# Patient Record
Sex: Female | Born: 1985 | Race: Black or African American | Hispanic: No | Marital: Single | Smoking: Never smoker
Health system: Southern US, Community
[De-identification: ages and names within clinical notes are randomized; demographics above are authoritative.]

## PROBLEM LIST (undated history)

## (undated) ENCOUNTER — Inpatient Hospital Stay (HOSPITAL_COMMUNITY): Payer: Self-pay

## (undated) DIAGNOSIS — G932 Benign intracranial hypertension: Secondary | ICD-10-CM

## (undated) DIAGNOSIS — O1420 HELLP syndrome (HELLP), unspecified trimester: Secondary | ICD-10-CM

## (undated) DIAGNOSIS — I10 Essential (primary) hypertension: Secondary | ICD-10-CM

## (undated) HISTORY — PX: DILATION AND CURETTAGE OF UTERUS: SHX78

## (undated) HISTORY — PX: INDUCED ABORTION: SHX677

---

## 2013-02-12 ENCOUNTER — Encounter (HOSPITAL_COMMUNITY): Payer: Self-pay | Admitting: Emergency Medicine

## 2013-02-12 ENCOUNTER — Emergency Department (HOSPITAL_COMMUNITY)
Admission: EM | Admit: 2013-02-12 | Discharge: 2013-02-12 | Discharge status: Against Medical Advice | Payer: Self-pay | Attending: Emergency Medicine | Admitting: Emergency Medicine

## 2013-02-12 DIAGNOSIS — N898 Other specified noninflammatory disorders of vagina: Secondary | ICD-10-CM | POA: Insufficient documentation

## 2013-02-12 DIAGNOSIS — O2390 Unspecified genitourinary tract infection in pregnancy, unspecified trimester: Secondary | ICD-10-CM | POA: Insufficient documentation

## 2013-02-12 DIAGNOSIS — O9989 Other specified diseases and conditions complicating pregnancy, childbirth and the puerperium: Secondary | ICD-10-CM | POA: Insufficient documentation

## 2013-02-12 DIAGNOSIS — R109 Unspecified abdominal pain: Secondary | ICD-10-CM | POA: Insufficient documentation

## 2013-02-12 NOTE — ED Notes (Signed)
Pt states she found out she was pregnant last Sunday.  States that she had been cramping since then and last night she started having some vaginal bleeding.  Pain 5/10.  States that there are clots in the bleeding.

## 2013-02-12 NOTE — ED Notes (Signed)
Pt called to reassess, no answer.  

## 2013-02-13 ENCOUNTER — Emergency Department (HOSPITAL_COMMUNITY): Payer: Self-pay

## 2013-02-13 ENCOUNTER — Emergency Department (HOSPITAL_COMMUNITY)
Admission: EM | Admit: 2013-02-13 | Discharge: 2013-02-14 | Disposition: A | Discharge status: Home / Self Care | Payer: Self-pay | Attending: Emergency Medicine | Admitting: Emergency Medicine

## 2013-02-13 ENCOUNTER — Encounter (HOSPITAL_COMMUNITY): Payer: Self-pay | Admitting: Family Medicine

## 2013-02-13 DIAGNOSIS — Z79899 Other long term (current) drug therapy: Secondary | ICD-10-CM | POA: Insufficient documentation

## 2013-02-13 DIAGNOSIS — O200 Threatened abortion: Secondary | ICD-10-CM | POA: Insufficient documentation

## 2013-02-13 DIAGNOSIS — O9989 Other specified diseases and conditions complicating pregnancy, childbirth and the puerperium: Secondary | ICD-10-CM | POA: Insufficient documentation

## 2013-02-13 DIAGNOSIS — R000 Tachycardia, unspecified: Secondary | ICD-10-CM | POA: Insufficient documentation

## 2013-02-13 DIAGNOSIS — N898 Other specified noninflammatory disorders of vagina: Secondary | ICD-10-CM | POA: Insufficient documentation

## 2013-02-13 DIAGNOSIS — Z3490 Encounter for supervision of normal pregnancy, unspecified, unspecified trimester: Secondary | ICD-10-CM

## 2013-02-13 DIAGNOSIS — O209 Hemorrhage in early pregnancy, unspecified: Secondary | ICD-10-CM | POA: Insufficient documentation

## 2013-02-13 LAB — URINALYSIS, ROUTINE W REFLEX MICROSCOPIC
Bilirubin Urine: NEGATIVE
Glucose, UA: NEGATIVE mg/dL
Nitrite: NEGATIVE
Specific Gravity, Urine: 1.009 (ref 1.005–1.030)
pH: 6 (ref 5.0–8.0)

## 2013-02-13 LAB — WET PREP, GENITAL: Trich, Wet Prep: NONE SEEN

## 2013-02-13 LAB — CBC WITH DIFFERENTIAL/PLATELET
HCT: 41 % (ref 36.0–46.0)
Hemoglobin: 13.6 g/dL (ref 12.0–15.0)
Lymphs Abs: 2.7 10*3/uL (ref 0.7–4.0)
Monocytes Relative: 8 % (ref 3–12)
Neutro Abs: 3.5 10*3/uL (ref 1.7–7.7)
Neutrophils Relative %: 51 % (ref 43–77)
RBC: 4.96 MIL/uL (ref 3.87–5.11)

## 2013-02-13 LAB — POCT PREGNANCY, URINE: Preg Test, Ur: POSITIVE — AB

## 2013-02-13 LAB — COMPREHENSIVE METABOLIC PANEL
Albumin: 4.2 g/dL (ref 3.5–5.2)
Alkaline Phosphatase: 64 U/L (ref 39–117)
BUN: 6 mg/dL (ref 6–23)
CO2: 26 mEq/L (ref 19–32)
Chloride: 99 mEq/L (ref 96–112)
GFR calc Af Amer: >90 mL/min (ref >90)
GFR calc non Af Amer: >90 mL/min (ref >90)
Glucose, Bld: 128 mg/dL — ABNORMAL HIGH (ref 70–99)
Potassium: 3.2 mEq/L — ABNORMAL LOW (ref 3.5–5.1)
Total Bilirubin: 0.3 mg/dL (ref 0.3–1.2)

## 2013-02-13 LAB — URINE MICROSCOPIC-ADD ON

## 2013-02-13 LAB — HCG, QUANTITATIVE, PREGNANCY: hCG, Beta Chain, Quant, S: 9629 m[IU]/mL — ABNORMAL HIGH (ref <5)

## 2013-02-13 NOTE — ED Provider Notes (Signed)
History     CSN: [id number]  Arrival date & time 02/13/13  1652   First MD Initiated Contact with Patient 02/13/13 1949      Chief Complaint  Patient presents with  . Threatened Miscarriage    (Consider location/radiation/quality/duration/timing/severity/associated sxs/prior treatment) HPI Comments: States LMP was Janurary bled for 2 weeks was getting off Depo at that time than no menses in Feb.  Yesterday had bleeding for 1-2 hours that stopped, than today had brown discharge  The history is provided by the patient.    History reviewed. No pertinent past medical history.  History reviewed. No pertinent past surgical history.  History reviewed. No pertinent family history.  History  Substance Use Topics  . Smoking status: Never Smoker   . Smokeless tobacco: Not on file  . Alcohol Use: Yes    OB History   Grav Para Term Preterm Abortions TAB SAB Ect Mult Living                  Review of Systems  Constitutional: Negative for fever and chills.  Respiratory: Negative for shortness of breath.   Cardiovascular: Negative for chest pain.  Gastrointestinal: Negative for abdominal pain.  Genitourinary: Positive for vaginal bleeding. Negative for dysuria, frequency, vaginal discharge and vaginal pain.  Musculoskeletal: Negative for back pain.  All other systems reviewed and are negative.    Allergies  Review of patient's allergies indicates no known allergies.  Home Medications   Current Outpatient Rx  Name  Route  Sig  Dispense  Refill  . Biotin 1 MG CAPS   Oral   Take 2 capsules by mouth daily.          . naproxen sodium (ANAPROX) 220 MG tablet   Oral   Take 660 mg by mouth daily as needed.         . Prenatal Vit-Fe Fumarate-FA (PRENATAL COMPLETE) 14-0.4 MG TABS   Oral   Take 1 tablet by mouth 1 day or 1 dose.   60 each   0     BP 129/69  Pulse 110  Temp(Src) 98 F (36.7 C) (Oral)  SpO2 100%  LMP 12/15/2012  Physical Exam  Nursing note and  vitals reviewed. Constitutional: She is oriented to person, place, and time. She appears well-developed and well-nourished.  HENT:  Head: Normocephalic.  Eyes: Pupils are equal, round, and reactive to light.  Neck: Normal range of motion.  Cardiovascular: Regular rhythm.  Tachycardia present.   Pulmonary/Chest: Effort normal and breath sounds normal.  Abdominal: Soft. Bowel sounds are normal. There is no tenderness.  Genitourinary: Uterus is enlarged. Uterus is not tender. Cervix exhibits no discharge. Right adnexum displays no tenderness. Left adnexum displays no tenderness. No bleeding around the vagina. Vaginal discharge found.  Musculoskeletal: Normal range of motion.  Neurological: She is alert and oriented to person, place, and time.  Skin: Skin is dry.    ED Course  Procedures (including critical care time)  Labs Reviewed  WET PREP, GENITAL - Abnormal; Notable for the following:    Yeast Wet Prep HPF POC FEW (*)    Clue Cells Wet Prep HPF POC FEW (*)    WBC, Wet Prep HPF POC MODERATE (*)    All other components within normal limits  URINALYSIS, ROUTINE W REFLEX MICROSCOPIC - Abnormal; Notable for the following:    APPearance CLOUDY (*)    Hgb urine dipstick MODERATE (*)    Leukocytes, UA MODERATE (*)    All other  components within normal limits  COMPREHENSIVE METABOLIC PANEL - Abnormal; Notable for the following:    Potassium 3.2 (*)    Glucose, Bld 128 (*)    All other components within normal limits  URINE MICROSCOPIC-ADD ON - Abnormal; Notable for the following:    Squamous Epithelial / LPF MANY (*)    Bacteria, UA MANY (*)    All other components within normal limits  HCG, QUANTITATIVE, PREGNANCY - Abnormal; Notable for the following:    hCG, Beta Chain, Quant, S 9629 (*)    All other components within normal limits  POCT PREGNANCY, URINE - Abnormal; Notable for the following:    Preg Test, Ur POSITIVE (*)    All other components within normal limits  URINE  CULTURE  GC/CHLAMYDIA PROBE AMP  CBC WITH DIFFERENTIAL  ABO/RH   US Ob Comp Less 14 Wks  02/13/2013  *RADIOLOGY REPORT*  Clinical Data: Pregnant, vaginal bleeding, threatened miscarriage, quantitative beta HCG 9629  OBSTETRIC <14 WK [place] AND TRANSVAGINAL OB US  Technique:  Both transabdominal and transvaginal ultrasound examinations were performed for complete evaluation of the gestation as well as the maternal uterus, adnexal regions, and pelvic cul-de-sac.  Transvaginal technique was performed to assess early pregnancy.  Comparison:  None  Intrauterine gestational sac:  Visualized/normal in shape. Yolk sac: Present Embryo: Not identified Cardiac Activity: Not identified Heart Rate: N/A bpm  MSD: 12/23 mm  6 w 3 d  [place] EDC: 10/09/2013  Maternal uterus/adnexae: No subchorionic hemorrhage. Right ovary normal size and morphology 2.1 x 2.6 x 3.3 cm. Left ovary measures 3.9 x 2.1 x 3.5 cm contains a slightly irregular cystic collection 2.1 cm greatest size question corpus luteal cyst. Probable uterine leiomyoma superior uterus to left, 1.1 x 1.6 x 1.3 cm. No other adnexal masses. Small amount of bilateral adnexal free pelvic fluid.  IMPRESSION: Intrauterine gestational sac containing a yolk sac identified measuring 6 weeks 3 days EGA by mean sac diameter; a fetal pole is not definitely visualized to assess viability. Potential corpus luteal cyst left ovary. Probable 1.6 cm uterine leiomyoma. Follow-up ultrasound recommended in 14 days to assess viability.   Original Report Authenticated By: Ulyses Southward, M.D.    US Ob Transvaginal  02/13/2013  *RADIOLOGY REPORT*  Clinical Data: Pregnant, vaginal bleeding, threatened miscarriage, quantitative beta HCG 9629  OBSTETRIC <14 WK [place] AND TRANSVAGINAL OB US  Technique:  Both transabdominal and transvaginal ultrasound examinations were performed for complete evaluation of the gestation as well as the maternal uterus, adnexal regions, and pelvic cul-de-sac.  Transvaginal  technique was performed to assess early pregnancy.  Comparison:  None  Intrauterine gestational sac:  Visualized/normal in shape. Yolk sac: Present Embryo: Not identified Cardiac Activity: Not identified Heart Rate: N/A bpm  MSD: 12/23 mm  6 w 3 d  [place] EDC: 10/09/2013  Maternal uterus/adnexae: No subchorionic hemorrhage. Right ovary normal size and morphology 2.1 x 2.6 x 3.3 cm. Left ovary measures 3.9 x 2.1 x 3.5 cm contains a slightly irregular cystic collection 2.1 cm greatest size question corpus luteal cyst. Probable uterine leiomyoma superior uterus to left, 1.1 x 1.6 x 1.3 cm. No other adnexal masses. Small amount of bilateral adnexal free pelvic fluid.  IMPRESSION: Intrauterine gestational sac containing a yolk sac identified measuring 6 weeks 3 days EGA by mean sac diameter; a fetal pole is not definitely visualized to assess viability. Potential corpus luteal cyst left ovary. Probable 1.6 cm uterine leiomyoma. Follow-up ultrasound recommended in 14 days to assess  viability.   Original Report Authenticated By: Ulyses Southward, M.D.      1. Pregnancy   2. Vaginal bleeding in pregnancy, first trimester       MDM   + IUP will dc home with vitamins and OB referral        Arman Filter, NP 02/14/13 0035  Arman Filter, NP 02/14/13 6578

## 2013-02-13 NOTE — ED Notes (Signed)
States found out pregnant last Sunday. Cramping x 1 week. Yesterday had large vaginal bleeding. Clots present.

## 2013-02-13 NOTE — ED Notes (Signed)
Per pt sts SUnday she took a home pregnancy test and was positive. sts bleeding started yesterday about 3 am and cramping. sts clots and enough to soak a pad. sts now dark brown and spotting.

## 2013-02-14 MED ORDER — PRENATAL COMPLETE 14-0.4 MG PO TABS: 1.0000 | ORAL_TABLET | ORAL | Status: DC

## 2013-02-15 LAB — URINE CULTURE

## 2013-02-15 LAB — GC/CHLAMYDIA PROBE AMP
CT Probe RNA: NEGATIVE
GC Probe RNA: NEGATIVE

## 2013-02-15 NOTE — ED Provider Notes (Signed)
Medical screening examination/treatment/procedure(s) were performed by non-physician practitioner and as supervising physician I was immediately available for consultation/collaboration.   Joya Gaskins, MD 02/15/13 1036

## 2013-02-17 ENCOUNTER — Encounter (HOSPITAL_COMMUNITY): Payer: Self-pay | Admitting: *Deleted

## 2013-02-17 ENCOUNTER — Inpatient Hospital Stay (HOSPITAL_COMMUNITY): Payer: Self-pay

## 2013-02-17 ENCOUNTER — Inpatient Hospital Stay (HOSPITAL_COMMUNITY)
Admission: AD | Admit: 2013-02-17 | Discharge: 2013-02-18 | Disposition: A | Discharge status: Home / Self Care | Payer: Self-pay | Source: Ambulatory Visit | Attending: Obstetrics and Gynecology | Admitting: Obstetrics and Gynecology

## 2013-02-17 DIAGNOSIS — R109 Unspecified abdominal pain: Secondary | ICD-10-CM | POA: Insufficient documentation

## 2013-02-17 DIAGNOSIS — O020 Blighted ovum and nonhydatidiform mole: Secondary | ICD-10-CM

## 2013-02-17 DIAGNOSIS — O0289 Other abnormal products of conception: Secondary | ICD-10-CM

## 2013-02-17 DIAGNOSIS — O99891 Other specified diseases and conditions complicating pregnancy: Secondary | ICD-10-CM | POA: Insufficient documentation

## 2013-02-17 LAB — CBC
Hemoglobin: 12 g/dL (ref 12.0–15.0)
MCH: 26.7 pg (ref 26.0–34.0)
MCHC: 32 g/dL (ref 30.0–36.0)
MCV: 83.3 fL (ref 78.0–100.0)
RBC: 4.5 MIL/uL (ref 3.87–5.11)

## 2013-02-17 LAB — HCG, QUANTITATIVE, PREGNANCY: hCG, Beta Chain, Quant, S: 9820 m[IU]/mL — ABNORMAL HIGH (ref <5)

## 2013-02-17 NOTE — MAU Provider Note (Signed)
History     CSN: [id number]  Arrival date and time: 02/17/13 8413   First Provider Initiated Contact with Patient 02/17/13 2220      Chief Complaint  Patient presents with  . Abdominal Cramping   HPI Pt is G3P0020 and presents with heavy bleeding this morning and cramping that stopped about 3 pm. She took Tylenol with relief in pain.  Pt states that at this time she is not cramping or bleeding.  Pt was seen on 3/9 at [facility] ED with bleeding  with clots which decreased to brown discharge when she was seen on 3/9. Pt is B POS.  Her HCG was 9629 on 3/9  Past Medical History  Diagnosis Date  . Medical history non-contributory     Past Surgical History  Procedure Laterality Date  . Dilation and curettage of uterus      History reviewed. No pertinent family history.  History  Substance Use Topics  . Smoking status: Never Smoker   . Smokeless tobacco: Not on file  . Alcohol Use: Yes    Allergies: No Known Allergies  Prescriptions prior to admission  Medication Sig Dispense Refill  . acetaminophen (TYLENOL) 325 MG tablet Take 650 mg by mouth every 6 (six) hours as needed for pain.      . Biotin 1 MG CAPS Take 2 capsules by mouth daily.         Review of Systems  Constitutional: Negative for fever and chills.  HENT: Negative for neck pain.   Gastrointestinal: Positive for abdominal pain. Negative for nausea, vomiting, diarrhea and constipation.  Genitourinary: Negative for dysuria and urgency.  Neurological: Negative for headaches.   Physical Exam   Blood pressure 144/81, pulse 86, temperature 98.2 F (36.8 C), resp. rate 20, height 5\' 2"  (1.575 m), weight 160 lb (72.576 kg), last menstrual period 12/15/2012, SpO2 100.00%.  Physical Exam  Nursing note and vitals reviewed. Constitutional: She is oriented to person, place, and time. She appears well-developed and well-nourished. No distress.  HENT:  Head: Normocephalic.  Eyes: Pupils are equal, round, and reactive to  light.  Neck: Normal range of motion. Neck supple.  Cardiovascular: Normal rate.   Respiratory: Effort normal.  GI: Soft. Bowel sounds are normal.  Genitourinary:  Deferred since done on 3/9  Musculoskeletal: Normal range of motion.  Neurological: She is alert and oriented to person, place, and time.  Skin: Skin is warm and dry.  Psychiatric: She has a normal mood and affect.    MAU Course  Procedures Results for orders placed during the hospital encounter of 02/17/13 (from the past 24 hour(s))  CBC     Status: None   Collection Time    02/17/13  9:35 PM      Result Value Range   WBC 7.7  4.0 - 10.5 K/uL   RBC 4.50  3.87 - 5.11 MIL/uL   Hemoglobin 12.0  12.0 - 15.0 g/dL   HCT 24.4  01.0 - 27.2 %   MCV 83.3  78.0 - 100.0 fL   MCH 26.7  26.0 - 34.0 pg   MCHC 32.0  30.0 - 36.0 g/dL   RDW 53.6  64.4 - 03.4 %   Platelets 271  150 - 400 K/uL  HCG, QUANTITATIVE, PREGNANCY     Status: Abnormal   Collection Time    02/17/13  9:35 PM      Result Value Range   hCG, Beta Chain, Quant, S 9820 (*) <5 mIU/mL  RADIOLOGY REPORT*  Clinical Data: Bleeding, cramping, minimal change in quantitative  beta HCG.  TRANSVAGINAL OBSTETRIC US  Technique: Transvaginal ultrasound was performed for complete  evaluation of the gestation as well as the maternal uterus, adnexal  regions, and pelvic cul-de-sac.  Comparison: None.  Intrauterine gestational sac: Not identified  Subchorionic hemorrhage: Not identified.  Normal sonographic appearance to the right ovary. Corpus luteal  cyst noted on the left.  Fundal fibroid measuring 1.8 cm.  Enlarged, heterogeneous endometrium, measuring 2.3 cm. Does not  appear hypervascular.  IMPRESSION:  Intrauterine gestational sac is no longer seen. The endometrium  appears heterogeneous and thickened, which may reflect blood clot.  No hypervascularity to favor retained products of conception, mass,  or endometritis. Recommend correlation with serial quantitative   beta HCG and ultrasound follow-up.  Original Report Authenticated By: Jearld Lesch, M.D.   Discussed with pt falling HCG and ultrasound- failed pregnancy Assessment and Plan  Probable anembryonic pregnancy with falling HCG Repeat HCG Sunday (~48 hrs) to make sure dropping   LINEBERRY,SUSAN 02/17/2013, 10:21 PM

## 2013-02-17 NOTE — MAU Note (Signed)
Pt states she has cramping and vaginal bleeding that started this morning

## 2013-02-18 NOTE — MAU Provider Note (Signed)
Attestation of Attending Supervision of Advanced Practitioner (CNM/NP): Evaluation and management procedures were performed by the Advanced Practitioner under my supervision and collaboration.  I have reviewed the Advanced Practitioner's note and chart, and I agree with the management and plan.  CONSTANT,PEGGY 02/18/2013 8:14 AM

## 2013-09-28 ENCOUNTER — Emergency Department (HOSPITAL_COMMUNITY)
Admission: EM | Admit: 2013-09-28 | Discharge: 2013-09-28 | Disposition: A | Discharge status: Home / Self Care | Payer: Medicaid Other | Attending: Emergency Medicine | Admitting: Emergency Medicine

## 2013-09-28 ENCOUNTER — Encounter (HOSPITAL_COMMUNITY): Payer: Self-pay | Admitting: Emergency Medicine

## 2013-09-28 ENCOUNTER — Ambulatory Visit: Payer: Self-pay

## 2013-09-28 DIAGNOSIS — Z7982 Long term (current) use of aspirin: Secondary | ICD-10-CM | POA: Insufficient documentation

## 2013-09-28 DIAGNOSIS — R51 Headache: Secondary | ICD-10-CM | POA: Insufficient documentation

## 2013-09-28 DIAGNOSIS — H53149 Visual discomfort, unspecified: Secondary | ICD-10-CM | POA: Insufficient documentation

## 2013-09-28 DIAGNOSIS — R110 Nausea: Secondary | ICD-10-CM | POA: Insufficient documentation

## 2013-09-28 DIAGNOSIS — R05 Cough: Secondary | ICD-10-CM | POA: Insufficient documentation

## 2013-09-28 DIAGNOSIS — R059 Cough, unspecified: Secondary | ICD-10-CM | POA: Insufficient documentation

## 2013-09-28 DIAGNOSIS — R079 Chest pain, unspecified: Secondary | ICD-10-CM | POA: Insufficient documentation

## 2013-09-28 DIAGNOSIS — R0602 Shortness of breath: Secondary | ICD-10-CM | POA: Insufficient documentation

## 2013-09-28 MED ORDER — DEXAMETHASONE SODIUM PHOSPHATE 10 MG/ML IJ SOLN
10.0000 mg | Freq: Once | INTRAMUSCULAR | Status: AC
  Administered 2013-09-28: 10 mg via INTRAVENOUS
  Filled 2013-09-28: qty 1

## 2013-09-28 MED ORDER — METOCLOPRAMIDE HCL 5 MG/ML IJ SOLN
10.0000 mg | Freq: Once | INTRAMUSCULAR | Status: AC
  Administered 2013-09-28: 10 mg via INTRAVENOUS
  Filled 2013-09-28: qty 2

## 2013-09-28 MED ORDER — DIPHENHYDRAMINE HCL 50 MG/ML IJ SOLN
25.0000 mg | Freq: Once | INTRAMUSCULAR | Status: AC
  Administered 2013-09-28: 25 mg via INTRAVENOUS
  Filled 2013-09-28: qty 1

## 2013-09-28 MED ORDER — SODIUM CHLORIDE 0.9 % IV BOLUS (SEPSIS)
1000.0000 mL | Freq: Once | INTRAVENOUS | Status: AC
  Administered 2013-09-28: 1000 mL via INTRAVENOUS

## 2013-09-28 NOTE — ED Provider Notes (Signed)
CSN: [id number]     Arrival date & time 09/28/13  1551 History   First MD Initiated Contact with Patient 09/28/13 1604     Chief Complaint  Patient presents with  . multiple complaints    (Consider location/radiation/quality/duration/timing/severity/associated sxs/prior Treatment) HPI Comments: 27 y/o female with headache x 2 days of gradual onset. Reports history of headaches. Frontal location. Associated with photophobia, nausea. No fever, nasal drainage, numbness, weakness, gait instability. Reports mild nonproductive cough. Had episode of left sided chest pain this morning on waking that quickly resolved. No current chest pain or SOB.   The history is provided by the patient. No language interpreter was used.    Past Medical History  Diagnosis Date  . Medical history non-contributory    Past Surgical History  Procedure Laterality Date  . Dilation and curettage of uterus     History reviewed. No pertinent family history. History  Substance Use Topics  . Smoking status: Never Smoker   . Smokeless tobacco: Not on file  . Alcohol Use: Yes   OB History   Grav Para Term Preterm Abortions TAB SAB Ect Mult Living   3    2 2          Review of Systems  Constitutional: Negative for fever and diaphoresis.  Respiratory: Positive for cough and shortness of breath. Negative for chest tightness.   Cardiovascular: Positive for chest pain. Negative for palpitations and leg swelling.  Gastrointestinal: Negative for abdominal pain and abdominal distention.  Musculoskeletal: Negative for gait problem.  All other systems reviewed and are negative.    Allergies  Review of patient's allergies indicates no known allergies.  Home Medications   Current Outpatient Rx  Name  Route  Sig  Dispense  Refill  . aspirin EC 81 MG tablet   Oral   Take 324 mg by mouth once.          BP 161/98  Pulse 97  Temp(Src) 98.2 F (36.8 C) (Oral)  Resp 16  Wt 162 lb (73.483 kg)  BMI 29.62 kg/m2   SpO2 100% Physical Exam  Constitutional: She is oriented to person, place, and time. She appears well-developed and well-nourished. No distress.  HENT:  Head: Normocephalic and atraumatic.  Mouth/Throat: Oropharynx is clear and moist.  Eyes: Conjunctivae and EOM are normal. Pupils are equal, round, and reactive to light.  Neck: Neck supple.  Cardiovascular: Normal rate, regular rhythm, normal heart sounds and intact distal pulses.   Pulmonary/Chest: Effort normal and breath sounds normal. She exhibits no tenderness.  Abdominal: Soft. Bowel sounds are normal. She exhibits no distension. There is no tenderness.  Musculoskeletal: Normal range of motion. She exhibits no edema.  Neurological: She is alert and oriented to person, place, and time. She has normal strength and normal reflexes. No cranial nerve deficit or sensory deficit. She displays a negative Romberg sign. Coordination and gait normal.  No pronator drift, normal FTN, no gait ataxia. CN II-XII intact. Visual fields intact.    ED Course  Procedures (including critical care time) Labs Review Labs Reviewed - No data to display Imaging Review No results found.  EKG Interpretation   None       Date: 09/28/2013  Rate: 97  Rhythm: normal sinus rhythm  QRS Axis: normal  Intervals: normal  ST/T Wave abnormalities: normal  Conduction Disutrbances:none  Narrative Interpretation: NSR, normal intervals, no ischemic changes  Old EKG Reviewed: none available   MDM   1. Headache     27  y/o female with no PMH presenting with headache. HA x 2 days of gradual onset with photophobia and nausea. No focal neuro deficits. No sinus pressure or drainage.  Short episode of left chest pain on awakening that quickly resolved. No fever, sputum production, palpitations, diaphoresis. PERC negative. Doubt ACS, PE, PNA, dissection, pneumothorax as pain short lived, resolved, and no other concerning associated symptoms. HA improving without  treatment. Nonfocal neurological exam, no meningismus.  Doubt CVA, temporal arteritis, glaucoma, dissection, intracranial mass, meningitis, carotid sinus thrombosis.   HA resolved with migraine cocktail. Appropriate for discharge with PCP f/u if headaches persist. Return precautions discussed and patient voiced understanding of instructions.   Labs and imaging reviewed in my medical decision making if ordered. Patient discussed with my attending, Dr. Micheline Maze.       Amy Kitchens, MD 09/29/13 2056

## 2013-09-28 NOTE — ED Notes (Signed)
Presents with Headache that began yesterday afternoon located in frontal lobe and has shifted to left side, pain associated with light and sound sensitivity, this am woke up with left sided chest pain described as "more painful when I breath in" associated with SOB, chest pain is currently a 0/10, headache 7/10. Pt alert, oriented, PERRLA, laughing and smiling in triage room. Breath sounds clear.

## 2013-09-28 NOTE — ED Notes (Signed)
IV attempted x2 without success, another RN to bedside to attempt

## 2013-10-02 NOTE — ED Provider Notes (Signed)
Medical screening examination/treatment/procedure(s) were conducted as a shared visit with resident-physician practitioner(s) and myself.  I personally evaluated the patient during the encounter.  Pt is a 27 y.o. female with pmhx as above presenting with main complaint of h/a.  VSS pt in NAD. NO focal neuro findings.   Pt found to have resolution of symptoms w/ migraine cocktail.  Doubt CVA/TIA, SAH, meningitis.  Pt safe for outpt f/u.Shanna Cisco, MD 10/02/13 1950

## 2013-12-08 NOTE — L&D Delivery Note (Signed)
Delivery Note At 3:04 PM a viable female was delivered via  (Presentation: direct OA), compound with the posterior arm.  APGAR: 8, 9, ; weight 3 lb 9.5 oz (1630 g).   Placenta status: delivered with cord traction via Scultz .  Cord:  with the following complications: tight nuchal cord x 1, clamped/cut prior to delivery of the anterior shoulder .  Cord pH: sent  Anesthesia: Epidural  Episiotomy: None Lacerations: Labial Suture Repair: 3.0 vicryl rapide Est. Blood Loss (mL): 200 ml  Mom to postpartum.  Baby to NICU.  [name],Amy Criss A 09/02/2014, 3:26 PM

## 2014-03-01 ENCOUNTER — Emergency Department (HOSPITAL_COMMUNITY)
Admission: EM | Admit: 2014-03-01 | Discharge: 2014-03-01 | Disposition: A | Discharge status: Home / Self Care | Payer: Medicaid Other | Attending: Emergency Medicine | Admitting: Emergency Medicine

## 2014-03-01 ENCOUNTER — Encounter (HOSPITAL_COMMUNITY): Payer: Self-pay | Admitting: Emergency Medicine

## 2014-03-01 DIAGNOSIS — Z3201 Encounter for pregnancy test, result positive: Secondary | ICD-10-CM | POA: Insufficient documentation

## 2014-03-01 DIAGNOSIS — R6883 Chills (without fever): Secondary | ICD-10-CM | POA: Insufficient documentation

## 2014-03-01 DIAGNOSIS — O219 Vomiting of pregnancy, unspecified: Secondary | ICD-10-CM | POA: Insufficient documentation

## 2014-03-01 LAB — CBC WITH DIFFERENTIAL/PLATELET
BASOS ABS: 0 10*3/uL (ref 0.0–0.1)
Basophils Relative: 0 % (ref 0–1)
Eosinophils Absolute: 0.1 10*3/uL (ref 0.0–0.7)
Eosinophils Relative: 1 % (ref 0–5)
HEMATOCRIT: 39.2 % (ref 36.0–46.0)
Hemoglobin: 13.2 g/dL (ref 12.0–15.0)
LYMPHS PCT: 18 % (ref 12–46)
Lymphs Abs: 1.5 10*3/uL (ref 0.7–4.0)
MCH: 28 pg (ref 26.0–34.0)
MCHC: 33.7 g/dL (ref 30.0–36.0)
MCV: 83.1 fL (ref 78.0–100.0)
Monocytes Absolute: 1.1 10*3/uL — ABNORMAL HIGH (ref 0.1–1.0)
Monocytes Relative: 13 % — ABNORMAL HIGH (ref 3–12)
NEUTROS ABS: 5.5 10*3/uL (ref 1.7–7.7)
Neutrophils Relative %: 68 % (ref 43–77)
PLATELETS: 301 10*3/uL (ref 150–400)
RBC: 4.72 MIL/uL (ref 3.87–5.11)
RDW: 12.9 % (ref 11.5–15.5)
WBC: 8.2 10*3/uL (ref 4.0–10.5)

## 2014-03-01 LAB — URINALYSIS, ROUTINE W REFLEX MICROSCOPIC
Bilirubin Urine: NEGATIVE
GLUCOSE, UA: NEGATIVE mg/dL
HGB URINE DIPSTICK: NEGATIVE
KETONES UR: 15 mg/dL — AB
Nitrite: NEGATIVE
Protein, ur: 30 mg/dL — AB
Specific Gravity, Urine: 1.02 (ref 1.005–1.030)
Urobilinogen, UA: 0.2 mg/dL (ref 0.0–1.0)
pH: 7 (ref 5.0–8.0)

## 2014-03-01 LAB — COMPREHENSIVE METABOLIC PANEL
ALT: 14 U/L (ref 0–35)
AST: 17 U/L (ref 0–37)
Albumin: 4 g/dL (ref 3.5–5.2)
Alkaline Phosphatase: 49 U/L (ref 39–117)
BUN: 5 mg/dL — ABNORMAL LOW (ref 6–23)
CHLORIDE: 93 meq/L — AB (ref 96–112)
CO2: 25 mEq/L (ref 19–32)
Calcium: 9 mg/dL (ref 8.4–10.5)
Creatinine, Ser: 0.66 mg/dL (ref 0.50–1.10)
GFR calc Af Amer: >90 mL/min (ref >90)
GFR calc non Af Amer: >90 mL/min (ref >90)
Glucose, Bld: 81 mg/dL (ref 70–99)
POTASSIUM: 4.1 meq/L (ref 3.7–5.3)
Sodium: 133 mEq/L — ABNORMAL LOW (ref 137–147)
Total Bilirubin: 0.3 mg/dL (ref 0.3–1.2)
Total Protein: 7.8 g/dL (ref 6.0–8.3)

## 2014-03-01 LAB — URINE MICROSCOPIC-ADD ON

## 2014-03-01 LAB — POC URINE PREG, ED: PREG TEST UR: POSITIVE — AB

## 2014-03-01 LAB — LIPASE, BLOOD: Lipase: 29 U/L (ref 11–59)

## 2014-03-01 MED ORDER — ONDANSETRON HCL 4 MG PO TABS: 4.0000 mg | ORAL_TABLET | Freq: Four times a day (QID) | ORAL | Status: DC

## 2014-03-01 MED ORDER — ONDANSETRON 4 MG PO TBDP
4.0000 mg | ORAL_TABLET | Freq: Once | ORAL | Status: AC
  Administered 2014-03-01: 4 mg via ORAL
  Filled 2014-03-01: qty 1

## 2014-03-01 NOTE — ED Notes (Signed)
Discharge and follow up instructions reviewed. Pt verbalized understanding.  

## 2014-03-01 NOTE — ED Notes (Signed)
Pt able to keep ginger ale down 

## 2014-03-01 NOTE — ED Notes (Addendum)
Pt reports nausea/vomiting x 1 wk. Pt states she has been unable to keep anything down. Hasn't had any solid foods in 2-3 days. Pt states he has chills, denies any abd pain.

## 2014-03-01 NOTE — Discharge Instructions (Signed)
Morning Sickness Morning sickness is when you feel sick to your stomach (nauseous) during pregnancy. This nauseous feeling may or may not come with vomiting. It often occurs in the morning but can be a problem any time of day. Morning sickness is most common during the first trimester, but it may continue throughout pregnancy. While morning sickness is unpleasant, it is usually harmless unless you develop severe and continual vomiting (hyperemesis gravidarum). This condition requires more intense treatment.  CAUSES  The cause of morning sickness is not completely known but seems to be related to normal hormonal changes that occur in pregnancy. RISK FACTORS You are at greater risk if you:  Experienced nausea or vomiting before your pregnancy.  Had morning sickness during a previous pregnancy.  Are pregnant with more than one baby, such as twins. TREATMENT  Do not use any medicines (prescription, over-the-counter, or herbal) for morning sickness without first talking to your health care provider. Your health care provider may prescribe or recommend:  Vitamin B6 supplements.  Anti-nausea medicines.  The herbal medicine ginger. HOME CARE INSTRUCTIONS   Only take over-the-counter or prescription medicines as directed by your health care provider.  Taking multivitamins before getting pregnant can prevent or decrease the severity of morning sickness in most women.   Eat a piece of dry toast or unsalted crackers before getting out of bed in the morning.   Eat five or six small meals a day.   Eat dry and bland foods (rice, baked potato). Foods high in carbohydrates are often helpful.  Do not drink liquids with your meals. Drink liquids between meals.   Avoid greasy, fatty, and spicy foods.   Get someone to cook for you if the smell of any food causes nausea and vomiting.   If you feel nauseous after taking prenatal vitamins, take the vitamins at night or with a snack.  Snack  on protein foods (nuts, yogurt, cheese) between meals if you are hungry.   Eat unsweetened gelatins for desserts.   Wearing an acupressure wristband (worn for sea sickness) may be helpful.   Acupuncture may be helpful.   Do not smoke.   Get a humidifier to keep the air in your house free of odors.   Get plenty of fresh air. SEEK MEDICAL CARE IF:   Your home remedies are not working, and you need medicine.  You feel dizzy or lightheaded.  You are losing weight. SEEK IMMEDIATE MEDICAL CARE IF:   You have persistent and uncontrolled nausea and vomiting.  You pass out (faint). Document Released: 01/15/2007 Document Revised: 07/27/2013 Document Reviewed: 05/11/2013 [facility] Patient Information 2014 [place], [place]. Pregnancy If you are planning on getting pregnant, it is a good idea to make a preconception appointment with your caregiver to discuss having a healthy lifestyle before getting pregnant. This includes diet, weight, exercise, taking prenatal vitamins (especially folic acid, which helps prevent brain and spinal cord defects), avoiding alcohol, smoking and illegal drugs, medical problems (diabetes, convulsions), family history of genetic problems, working conditions, and immunizations. It is better to have knowledge of these things and do something about them before getting pregnant. During your pregnancy, it is important to follow certain guidelines in order to have a healthy baby. It is very important to get good prenatal care and follow your caregiver's instructions. Prenatal care includes all the medical care you receive before your baby's birth. This helps to prevent problems during the pregnancy and childbirth. HOME CARE INSTRUCTIONS   Start your prenatal visits by  the 12th week of pregnancy or earlier, if possible. At first, appointments are usually scheduled monthly. They become more frequent in the last 2 months before delivery. It is important that you keep your  caregiver's appointments and follow your caregiver's instructions regarding medication use, exercise, and diet.  During pregnancy, you are providing food for you and your baby. Eat a regular, well-balanced diet. Choose foods such as meat, fish, milk and other dairy products, vegetables, fruits, whole-grain breads and cereals. Your caregiver will inform you of the ideal weight gain depending on your current height and weight. Drink lots of liquids. Try to drink 8 glasses of water a day.  Alcohol is associated with a number of birth defects including fetal alcohol syndrome. It is best to avoid alcohol completely. Smoking will cause low birth rate and prematurity. Use of alcohol and nicotine during your pregnancy also increases the chances that your child will be chemically dependent later in their life and may contribute to SIDS (Sudden Infant Death Syndrome).  Do not use illegal drugs.  Only take prescription or over-the-counter medications that are recommended by your caregiver. Other medications can cause genetic and physical problems in the baby.  Morning sickness can often be helped by keeping soda crackers at the bedside. Eat a few before getting up in the morning.  A sexual relationship may be continued until near the end of pregnancy if there are no other problems such as early (premature) leaking of amniotic fluid from the membranes, vaginal bleeding, painful intercourse or belly (abdominal) pain.  Exercise regularly. Check with your caregiver if you are unsure of the safety of some of your exercises.  Do not use hot tubs, steam rooms or saunas. These increase the risk of fainting and hurting yourself and the baby. Swimming is OK for exercise. Get plenty of rest, including afternoon naps when possible, especially in the third trimester.  Avoid toxic odors and chemicals.  Do not wear high heels. They may cause you to lose your balance and fall.  Do not lift over 5 pounds. If you do lift  anything, lift with your legs and thighs, not your back.  Avoid long trips, especially in the third trimester.  If you have to travel out of the city or state, take a copy of your medical records with you. SEEK IMMEDIATE MEDICAL CARE IF:   You develop an unexplained oral temperature above 102 F (38.9 C), or as your caregiver suggests.  You have leaking of fluid from the vagina. If leaking membranes are suspected, take your temperature and inform your caregiver of this when you call.  There is vaginal spotting or bleeding. Notify your caregiver of the amount and how many pads are used.  You continue to feel sick to your stomach (nauseous) and have no relief from remedies suggested, or you throw up (vomit) blood or coffee ground like materials.  You develop upper abdominal pain.  You have round ligament discomfort in the lower abdominal area. This still must be evaluated by your caregiver.  You feel contractions of the uterus.  You do not feel the baby move, or there is less movement than before.  You have painful urination.  You have abnormal vaginal discharge.  You have persistent diarrhea.  You get a severe headache.  You have problems with your vision.  You develop muscle weakness.  You feel dizzy and faint.  You develop shortness of breath.  You develop chest pain.  You have back pain that travels down  to your leg and feet.  You feel irregular or a very fast heartbeat.  You develop excessive weight gain in a short period of time (5 pounds in 3 to 5 days).  You are involved in a domestic violence situation. Document Released: 11/24/2005 Document Revised: 05/25/2012 Document Reviewed: 05/18/2009 [facility] Patient Information 2014 [place], [place].

## 2014-03-01 NOTE — ED Notes (Signed)
Pt reports vomiting for 1 week all during the day. Reports unsure if pregnant due to irregular periods. States chills, no diarrhea. Denies abdominal pain. Pt is a x 4. In NAD

## 2014-03-02 NOTE — ED Provider Notes (Signed)
CSN: [id number]     Arrival date & time 03/01/14  1604 History   First MD Initiated Contact with Patient 03/01/14 1946     Chief Complaint  Patient presents with  . Emesis     (Consider location/radiation/quality/duration/timing/severity/associated sxs/prior Treatment) Patient is a 28 y.o. female presenting with vomiting. The history is provided by the patient. No language interpreter was used.  Emesis Severity:  Moderate Duration:  1 week Timing:  Intermittent Number of daily episodes:  4 Quality:  Stomach contents Progression:  Unchanged Chronicity:  New Recent urination:  Normal Relieved by:  Nothing Worsened by:  Food smell and liquids Associated symptoms: chills   Associated symptoms: no abdominal pain, no arthralgias, no cough, no diarrhea, no fever, no headaches, no myalgias, no sore throat and no URI   Risk factors comment:  Missed period   Past Medical History  Diagnosis Date  . Medical history non-contributory    Past Surgical History  Procedure Laterality Date  . Dilation and curettage of uterus     No family history on file. History  Substance Use Topics  . Smoking status: Never Smoker   . Smokeless tobacco: Not on file  . Alcohol Use: Yes   OB History   Grav Para Term Preterm Abortions TAB SAB Ect Mult Living   3    2 2          Review of Systems  Constitutional: Positive for chills. Negative for fever, diaphoresis, activity change, appetite change and fatigue.  HENT: Negative for congestion, facial swelling, rhinorrhea and sore throat.   Eyes: Negative for photophobia and discharge.  Respiratory: Negative for cough, chest tightness and shortness of breath.   Cardiovascular: Negative for chest pain, palpitations and leg swelling.  Gastrointestinal: Positive for nausea and vomiting. Negative for abdominal pain and diarrhea.  Endocrine: Negative for polydipsia and polyuria.  Genitourinary: Negative for dysuria, frequency, difficulty urinating and  pelvic pain.  Musculoskeletal: Negative for arthralgias, back pain, myalgias, neck pain and neck stiffness.  Skin: Negative for color change and wound.  Allergic/Immunologic: Negative for immunocompromised state.  Neurological: Negative for facial asymmetry, weakness, numbness and headaches.  Hematological: Does not bruise/bleed easily.  Psychiatric/Behavioral: Negative for confusion and agitation.      Allergies  Review of patient's allergies indicates no known allergies.  Home Medications   Current Outpatient Rx  Name  Route  Sig  Dispense  Refill  . ondansetron (ZOFRAN) 4 MG tablet   Oral   Take 1 tablet (4 mg total) by mouth every 6 (six) hours.   12 tablet   0    BP 112/69  Pulse 86  Temp(Src) 99 F (37.2 C) (Oral)  Resp 18  Ht 5\' 2"  (1.575 m)  Wt 155 lb (70.308 kg)  BMI 28.34 kg/m2  SpO2 97%  LMP 01/11/2014 Physical Exam  Constitutional: She is oriented to person, place, and time. She appears well-developed and well-nourished. No distress.  HENT:  Head: Normocephalic and atraumatic.  Mouth/Throat: No oropharyngeal exudate.  Eyes: Pupils are equal, round, and reactive to light.  Neck: Normal range of motion. Neck supple.  Cardiovascular: Normal rate, regular rhythm and normal heart sounds.  Exam reveals no gallop and no friction rub.   No murmur heard. Pulmonary/Chest: Effort normal and breath sounds normal. No respiratory distress. She has no wheezes. She has no rales.  Abdominal: Soft. Bowel sounds are normal. She exhibits no distension and no mass. There is no tenderness. There is no rebound and no  guarding.  Musculoskeletal: Normal range of motion. She exhibits no edema and no tenderness.  Neurological: She is alert and oriented to person, place, and time.  Skin: Skin is warm and dry.  Psychiatric: She has a normal mood and affect.    ED Course  Procedures (including critical care time) Labs Review Labs Reviewed  CBC WITH DIFFERENTIAL - Abnormal;  Notable for the following:    Monocytes Relative 13 (*)    Monocytes Absolute 1.1 (*)    All other components within normal limits  COMPREHENSIVE METABOLIC PANEL - Abnormal; Notable for the following:    Sodium 133 (*)    Chloride 93 (*)    BUN 5 (*)    All other components within normal limits  URINALYSIS, ROUTINE W REFLEX MICROSCOPIC - Abnormal; Notable for the following:    APPearance HAZY (*)    Ketones, ur 15 (*)    Protein, ur 30 (*)    Leukocytes, UA TRACE (*)    All other components within normal limits  URINE MICROSCOPIC-ADD ON - Abnormal; Notable for the following:    Squamous Epithelial / LPF MANY (*)    Bacteria, UA FEW (*)    All other components within normal limits  POC URINE PREG, ED - Abnormal; Notable for the following:    Preg Test, Ur POSITIVE (*)    All other components within normal limits  LIPASE, BLOOD   Imaging Review No results found.   EKG Interpretation None      MDM   Final diagnoses:  Nausea/vomiting in pregnancy    Pt is a 28 y.o. female with Pmhx as above who presents with 1 week of nausea and 3-4 daily episodes of non-bloody, non-bilious emesis. LMP feb 4th. POc preg positive. She denies ab pain, vag bleeding, inc vaginal d/c, fever, urinary symptoms. On PE, VSS, pt in NAD and appears well-hydrated. She felt improved and tolerate PO after an ODT zofran. I do not feel she requires eval for ectopic as she has had no ab pain, vag bleeding, lightheadedness or syncope. [facility] Rx for zofran given.         Shanna Cisco, MD 03/02/14 [phone]

## 2014-04-04 ENCOUNTER — Inpatient Hospital Stay (HOSPITAL_COMMUNITY)
Admission: AD | Admit: 2014-04-04 | Discharge: 2014-04-04 | Disposition: A | Discharge status: Home / Self Care | Payer: Self-pay | Source: Ambulatory Visit | Attending: Obstetrics & Gynecology | Admitting: Obstetrics & Gynecology

## 2014-04-04 ENCOUNTER — Encounter (HOSPITAL_COMMUNITY): Payer: Self-pay | Admitting: *Deleted

## 2014-04-04 DIAGNOSIS — O210 Mild hyperemesis gravidarum: Secondary | ICD-10-CM | POA: Insufficient documentation

## 2014-04-04 DIAGNOSIS — O219 Vomiting of pregnancy, unspecified: Secondary | ICD-10-CM

## 2014-04-04 LAB — URINALYSIS, ROUTINE W REFLEX MICROSCOPIC
BILIRUBIN URINE: NEGATIVE
Glucose, UA: NEGATIVE mg/dL
Ketones, ur: >80 mg/dL — AB
Leukocytes, UA: NEGATIVE
NITRITE: NEGATIVE
Protein, ur: >300 mg/dL — AB
Specific Gravity, Urine: >1.03 — ABNORMAL HIGH (ref 1.005–1.030)
UROBILINOGEN UA: 0.2 mg/dL (ref 0.0–1.0)
pH: 6 (ref 5.0–8.0)

## 2014-04-04 LAB — COMPREHENSIVE METABOLIC PANEL
ALT: 12 U/L (ref 0–35)
AST: 16 U/L (ref 0–37)
Albumin: 4.3 g/dL (ref 3.5–5.2)
Alkaline Phosphatase: 47 U/L (ref 39–117)
BUN: 11 mg/dL (ref 6–23)
CO2: 18 meq/L — AB (ref 19–32)
Calcium: 10.1 mg/dL (ref 8.4–10.5)
Chloride: 95 mEq/L — ABNORMAL LOW (ref 96–112)
Creatinine, Ser: 0.73 mg/dL (ref 0.50–1.10)
GFR calc Af Amer: >90 mL/min (ref >90)
GLUCOSE: 123 mg/dL — AB (ref 70–99)
POTASSIUM: 3.9 meq/L (ref 3.7–5.3)
Sodium: 138 mEq/L (ref 137–147)
TOTAL PROTEIN: 8.7 g/dL — AB (ref 6.0–8.3)
Total Bilirubin: 0.3 mg/dL (ref 0.3–1.2)

## 2014-04-04 LAB — URINE MICROSCOPIC-ADD ON

## 2014-04-04 MED ORDER — FAMOTIDINE IN NACL 20-0.9 MG/50ML-% IV SOLN
20.0000 mg | Freq: Once | INTRAVENOUS | Status: AC
  Administered 2014-04-04: 20 mg via INTRAVENOUS
  Filled 2014-04-04: qty 50

## 2014-04-04 MED ORDER — M.V.I. ADULT IV INJ
Freq: Once | INTRAVENOUS | Status: AC
  Administered 2014-04-04: 12:00:00 via INTRAVENOUS
  Filled 2014-04-04: qty 1000

## 2014-04-04 MED ORDER — METOCLOPRAMIDE HCL 5 MG/ML IJ SOLN
10.0000 mg | Freq: Once | INTRAMUSCULAR | Status: AC
  Administered 2014-04-04: 10 mg via INTRAVENOUS
  Filled 2014-04-04: qty 2

## 2014-04-04 MED ORDER — PROMETHAZINE HCL 25 MG/ML IJ SOLN
25.0000 mg | Freq: Once | INTRAVENOUS | Status: AC
  Administered 2014-04-04: 25 mg via INTRAVENOUS
  Filled 2014-04-04: qty 1

## 2014-04-04 MED ORDER — PROMETHAZINE HCL 25 MG PO TABS: 12.5000 mg | ORAL_TABLET | Freq: Four times a day (QID) | ORAL | Status: DC | PRN

## 2014-04-04 MED ORDER — ONDANSETRON HCL 4 MG/2ML IJ SOLN
4.0000 mg | Freq: Once | INTRAMUSCULAR | Status: AC
  Administered 2014-04-04: 4 mg via INTRAVENOUS
  Filled 2014-04-04: qty 2

## 2014-04-04 MED ORDER — ONDANSETRON 4 MG PO TBDP: 4.0000 mg | ORAL_TABLET | Freq: Three times a day (TID) | ORAL | Status: DC | PRN

## 2014-04-04 MED ORDER — RANITIDINE HCL 150 MG PO TABS: 150.0000 mg | ORAL_TABLET | Freq: Two times a day (BID) | ORAL | Status: DC

## 2014-04-04 NOTE — MAU Note (Signed)
Can't keep stuff down.  Has  Lost 30lb, before found out preg was 165.Marland Kitchen. Last night started coughing up blood, throat is really sore.  Feels so weak.  [facility] for [facility]fran- no refills- has first appt at health dept tomorrow.

## 2014-04-04 NOTE — Discharge Instructions (Signed)
Morning Sickness °Morning sickness is when you feel sick to your stomach (nauseous) during pregnancy. This nauseous feeling may or may not come with vomiting. It often occurs in the morning but can be a problem any time of day. Morning sickness is most common during the first trimester, but it may continue throughout pregnancy. While morning sickness is unpleasant, it is usually harmless unless you develop severe and continual vomiting (hyperemesis gravidarum). This condition requires more intense treatment.  °CAUSES  °The cause of morning sickness is not completely known but seems to be related to normal hormonal changes that occur in pregnancy. °RISK FACTORS °You are at greater risk if you: °· Experienced nausea or vomiting before your pregnancy. °· Had morning sickness during a previous pregnancy. °· Are pregnant with more than one baby, such as twins. °TREATMENT  °Do not use any medicines (prescription, over-the-counter, or herbal) for morning sickness without first talking to your health care provider. Your health care provider may prescribe or recommend: °· Vitamin B6 supplements. °· Anti-nausea medicines. °· The herbal medicine ginger. °HOME CARE INSTRUCTIONS  °· Only take over-the-counter or prescription medicines as directed by your health care provider. °· Taking multivitamins before getting pregnant can prevent or decrease the severity of morning sickness in most women.   °· Eat a piece of dry toast or unsalted crackers before getting out of bed in the morning.   °· Eat five or six small meals a day.   °· Eat dry and bland foods (rice, baked potato ). Foods high in carbohydrates are often helpful.  °· Do not drink liquids with your meals. Drink liquids between meals.   °· Avoid greasy, fatty, and spicy foods.   °· Get someone to cook for you if the smell of any food causes nausea and vomiting.   °· If you feel nauseous after taking prenatal vitamins, take the vitamins at night or with a snack.  °· Snack  on protein foods (nuts, yogurt, cheese) between meals if you are hungry.   °· Eat unsweetened gelatins for desserts.   °· Wearing an acupressure wristband (worn for sea sickness) may be helpful.   °· Acupuncture may be helpful.   °· Do not smoke.   °· Get a humidifier to keep the air in your house free of odors.   °· Get plenty of fresh air. °SEEK MEDICAL CARE IF:  °· Your home remedies are not working, and you need medicine. °· You feel dizzy or lightheaded. °· You are losing weight. °SEEK IMMEDIATE MEDICAL CARE IF:  °· You have persistent and uncontrolled nausea and vomiting. °· You pass out (faint). °Document Released: 01/15/2007 Document Revised: 07/27/2013 Document Reviewed: 05/11/2013 °[employer]® Patient Information ©2014 [employer]. ° °

## 2014-04-04 NOTE — MAU Provider Note (Signed)
Attestation of Attending Supervision of Advanced Practitioner (CNM/NP): Evaluation and management procedures were performed by the Advanced Practitioner under my supervision and collaboration.  I have reviewed the Advanced Practitioner's note and chart, and I agree with the management and plan.  Jerita Wimbush Harraway-Smith 4:59 PM

## 2014-04-04 NOTE — MAU Provider Note (Signed)
History     CSN: [id number]  Arrival date and time: 04/04/14 [id number]   First Provider Initiated Contact with Patient 04/04/14 1003      Chief Complaint  Patient presents with  . Emesis   HPI  [name] is a 28 y.o.female [id number] at G4P0030 who presents with N/V. She was given Zofran in March from one of the ED's, however she ran out of the medication. She feels the zofran really helped her N/V. She is vomiting approximately 12 times per day and has lost a significant amount of weight. Zofran has been the only medication that she has taken for N/V and she states no one has discussed the risks of the medication.  [facility].   OB History   Grav Para Term Preterm Abortions TAB SAB Ect Mult Living   4    3 2 1          Past Medical History  Diagnosis Date  . Medical history non-contributory     Past Surgical History  Procedure Laterality Date  . Dilation and curettage of uterus      History reviewed. No pertinent family history.  History  Substance Use Topics  . Smoking status: Never Smoker   . Smokeless tobacco: Not on file  . Alcohol Use: Yes    Allergies: No Known Allergies  Prescriptions prior to admission  Medication Sig Dispense Refill  . ondansetron (ZOFRAN) 4 MG tablet Take 1 tablet (4 mg total) by mouth every 6 (six) hours.  12 tablet  0  . Prenatal Vit-Fe Fumarate-FA (PRENATAL MULTIVITAMIN) TABS tablet Take 1 tablet by mouth daily at 12 noon.       Results for orders placed during the hospital encounter of 04/04/14 (from the past 48 hour(s))  URINALYSIS, ROUTINE W REFLEX MICROSCOPIC     Status: Abnormal   Collection Time    04/04/14  9:30 AM      Result Value Ref Range   Color, Urine YELLOW  YELLOW   APPearance HAZY (*) CLEAR   Specific Gravity, Urine >1.030 (*) 1.005 - 1.030   pH 6.0  5.0 - 8.0   Glucose, UA NEGATIVE  NEGATIVE mg/dL   Hgb urine dipstick TRACE (*) NEGATIVE   Bilirubin Urine NEGATIVE   NEGATIVE   Ketones, ur >80 (*) NEGATIVE mg/dL   Protein, ur >119>300 (*) NEGATIVE mg/dL   Urobilinogen, UA 0.2  0.0 - 1.0 mg/dL   Nitrite NEGATIVE  NEGATIVE   Leukocytes, UA NEGATIVE  NEGATIVE  URINE MICROSCOPIC-ADD ON     Status: Abnormal   Collection Time    04/04/14  9:30 AM      Result Value Ref Range   Squamous Epithelial / LPF MANY (*) RARE   WBC, UA 0-2  <3 WBC/hpf   Bacteria, UA FEW (*) RARE   Urine-Other MUCOUS PRESENT      Review of Systems  Constitutional: Negative for fever and chills.  Gastrointestinal: Positive for heartburn, nausea, vomiting and constipation. Negative for abdominal pain and diarrhea.  Genitourinary: Negative for dysuria, urgency, frequency and hematuria.       No vaginal discharge. No vaginal bleeding. No dysuria.   Neurological: Negative for headaches.   Physical Exam   Blood pressure 133/72, pulse 87, temperature 98.7 F (37.1 C), temperature source Oral, resp. rate 16, height 5\' 2"  (1.575 m), weight 62.143 kg (137 lb), last menstrual period 01/11/2014.  Physical Exam  Constitutional: She is oriented to person,  place, and time. She appears well-developed and well-nourished. No distress.  HENT:  Head: Normocephalic.  Eyes: Pupils are equal, round, and reactive to light.  Neck: Neck supple.  Respiratory: Effort normal.  GI: Soft. She exhibits no distension. There is no tenderness.  Musculoskeletal: Normal range of motion.  Neurological: She is alert and oriented to person, place, and time.  Skin: Skin is warm. She is not diaphoretic.  Psychiatric: Her behavior is normal.    MAU Course  Procedures None  MDM +fht  Pepcid 20 mg IVPB LR with MV Reglan 10 mg  Zofran 4 mg Phenergan 25 mg in D5LR Pt feeling better. She would like zofran prior to discharge home. Discussed the risks of using zofran in the first trimester; patient aware.  Pt is resting well and is ready to go home.   Assessment and Plan   A:  1. Nausea and vomiting in  pregnancy     P:  Discharge home in stable condition RX: Zofran - risks discussed at length. Pt voices understanding        Zantac        Phenergan  Follow up with the health department as scheduled Return to MAU if symptoms worsen BRAT diet discussed   Iona HansenJennifer Irene Konor Noren 04/04/2014, 10:03 AM

## 2014-04-05 ENCOUNTER — Encounter: Payer: Self-pay | Admitting: Obstetrics and Gynecology

## 2014-05-17 LAB — OB RESULTS CONSOLE HIV ANTIBODY (ROUTINE TESTING): HIV: NONREACTIVE

## 2014-05-17 LAB — OB RESULTS CONSOLE RUBELLA ANTIBODY, IGM: RUBELLA: IMMUNE

## 2014-05-17 LAB — OB RESULTS CONSOLE ANTIBODY SCREEN: ANTIBODY SCREEN: NEGATIVE

## 2014-05-17 LAB — OB RESULTS CONSOLE GC/CHLAMYDIA
Chlamydia: NEGATIVE
GC PROBE AMP, GENITAL: NEGATIVE

## 2014-05-17 LAB — OB RESULTS CONSOLE RPR: RPR: NONREACTIVE

## 2014-08-17 ENCOUNTER — Inpatient Hospital Stay (HOSPITAL_COMMUNITY)
Admission: AD | Admit: 2014-08-17 | Discharge: 2014-08-17 | Disposition: A | Discharge status: Home / Self Care | Payer: Medicaid Other | Source: Ambulatory Visit | Attending: Obstetrics | Admitting: Obstetrics

## 2014-08-17 ENCOUNTER — Encounter (HOSPITAL_COMMUNITY): Payer: Self-pay | Admitting: *Deleted

## 2014-08-17 DIAGNOSIS — O212 Late vomiting of pregnancy: Secondary | ICD-10-CM | POA: Insufficient documentation

## 2014-08-17 DIAGNOSIS — O219 Vomiting of pregnancy, unspecified: Secondary | ICD-10-CM

## 2014-08-17 LAB — COMPREHENSIVE METABOLIC PANEL
ALT: 11 U/L (ref 0–35)
ANION GAP: 26 — AB (ref 5–15)
AST: 21 U/L (ref 0–37)
Albumin: 3.3 g/dL — ABNORMAL LOW (ref 3.5–5.2)
Alkaline Phosphatase: 87 U/L (ref 39–117)
BUN: 5 mg/dL — AB (ref 6–23)
CALCIUM: 9.4 mg/dL (ref 8.4–10.5)
CO2: 12 mEq/L — ABNORMAL LOW (ref 19–32)
Chloride: 102 mEq/L (ref 96–112)
Creatinine, Ser: 0.58 mg/dL (ref 0.50–1.10)
GFR calc Af Amer: >90 mL/min (ref >90)
GFR calc non Af Amer: >90 mL/min (ref >90)
Glucose, Bld: 103 mg/dL — ABNORMAL HIGH (ref 70–99)
Potassium: 3.9 mEq/L (ref 3.7–5.3)
Sodium: 140 mEq/L (ref 137–147)
Total Bilirubin: 0.4 mg/dL (ref 0.3–1.2)
Total Protein: 7.1 g/dL (ref 6.0–8.3)

## 2014-08-17 LAB — CBC WITH DIFFERENTIAL/PLATELET
BASOS ABS: 0 10*3/uL (ref 0.0–0.1)
BASOS PCT: 0 % (ref 0–1)
EOS ABS: 0 10*3/uL (ref 0.0–0.7)
Eosinophils Relative: 0 % (ref 0–5)
HCT: 35.2 % — ABNORMAL LOW (ref 36.0–46.0)
Hemoglobin: 11.3 g/dL — ABNORMAL LOW (ref 12.0–15.0)
Lymphocytes Relative: 13 % (ref 12–46)
Lymphs Abs: 1.4 10*3/uL (ref 0.7–4.0)
MCH: 26.3 pg (ref 26.0–34.0)
MCHC: 32.1 g/dL (ref 30.0–36.0)
MCV: 82.1 fL (ref 78.0–100.0)
Monocytes Absolute: 1 10*3/uL (ref 0.1–1.0)
Monocytes Relative: 10 % (ref 3–12)
NEUTROS ABS: 8 10*3/uL — AB (ref 1.7–7.7)
NEUTROS PCT: 77 % (ref 43–77)
Platelets: 260 10*3/uL (ref 150–400)
RBC: 4.29 MIL/uL (ref 3.87–5.11)
RDW: 13.8 % (ref 11.5–15.5)
WBC: 10.4 10*3/uL (ref 4.0–10.5)

## 2014-08-17 LAB — URINALYSIS, ROUTINE W REFLEX MICROSCOPIC
Bilirubin Urine: NEGATIVE
Glucose, UA: NEGATIVE mg/dL
LEUKOCYTES UA: NEGATIVE
NITRITE: NEGATIVE
PROTEIN: 100 mg/dL — AB
Specific Gravity, Urine: >1.03 — ABNORMAL HIGH (ref 1.005–1.030)
Urobilinogen, UA: 0.2 mg/dL (ref 0.0–1.0)
pH: 6 (ref 5.0–8.0)

## 2014-08-17 LAB — URINE MICROSCOPIC-ADD ON

## 2014-08-17 MED ORDER — FAMOTIDINE IN NACL 20-0.9 MG/50ML-% IV SOLN
20.0000 mg | Freq: Once | INTRAVENOUS | Status: AC
  Administered 2014-08-17: 20 mg via INTRAVENOUS
  Filled 2014-08-17: qty 50

## 2014-08-17 MED ORDER — PROMETHAZINE HCL 25 MG RE SUPP: 25.0000 mg | Freq: Four times a day (QID) | RECTAL | Status: DC | PRN

## 2014-08-17 MED ORDER — PROMETHAZINE HCL 25 MG/ML IJ SOLN
25.0000 mg | Freq: Once | INTRAMUSCULAR | Status: AC
  Administered 2014-08-17: 25 mg via INTRAVENOUS
  Filled 2014-08-17: qty 1

## 2014-08-17 MED ORDER — DEXTROSE 5 % IN LACTATED RINGERS IV BOLUS
1000.0000 mL | Freq: Once | INTRAVENOUS | Status: AC
  Administered 2014-08-17: 1000 mL via INTRAVENOUS

## 2014-08-17 MED ORDER — ONDANSETRON HCL 4 MG PO TABS: 4.0000 mg | ORAL_TABLET | Freq: Four times a day (QID) | ORAL | Status: DC

## 2014-08-17 MED ORDER — ONDANSETRON 8 MG/NS 50 ML IVPB
8.0000 mg | Freq: Once | INTRAVENOUS | Status: AC
  Administered 2014-08-17: 8 mg via INTRAVENOUS
  Filled 2014-08-17: qty 8

## 2014-08-17 NOTE — MAU Note (Signed)
Patient states she has had vomiting with the entire pregnancy. Has not been able to keep anything down since 9-7. Denies pain, bleeding or leaking and reports good fetal movement.

## 2014-08-17 NOTE — MAU Note (Signed)
Has had hyperemesis throughout her pregnancy and has been using Phenergan but since Monday, pt has not had any relief and has not been aable to "keep anything" down; states that the Phenergan is not working any more;

## 2014-08-17 NOTE — MAU Provider Note (Signed)
History     CSN: [id number]  Arrival date and time: 08/17/14 1127   First Provider Initiated Contact with Patient 08/17/14 1203      Chief Complaint  Patient presents with  . Emesis During Pregnancy   HPI [name] is a 28 y.o. G4P0030 at [redacted]w[redacted]d who presents to MAU today with complaint of N/V. She states that this has been an issue throughout the pregnancy and was well-controlled with Phenergan at home recently. She states that since Monday she has had excessive N/V and "can't keep anything down." she also complains of heartburn. She denies abdominal pain, vaginal bleeding or LOF. She reports good fetal movement.   OB History   Grav Para Term Preterm Abortions TAB SAB Ect Mult Living   0      Past Medical History  Diagnosis Date  . Medical history non-contributory     Past Surgical History  Procedure Laterality Date  . Dilation and curettage of uterus    . Induced abortion      Family History  Problem Relation Age of Onset  . Heart disease Father     History  Substance Use Topics  . Smoking status: Never Smoker   . Smokeless tobacco: Not on file  . Alcohol Use: Yes    Allergies: No Known Allergies  Prescriptions prior to admission  Medication Sig Dispense Refill  . acetaminophen (TYLENOL) 500 MG tablet Take 1,000 mg by mouth every 6 (six) hours as needed.      . Prenatal Vit-Fe Fumarate-FA (PRENATAL MULTIVITAMIN) TABS tablet Take 1 tablet by mouth daily at 12 noon.      . promethazine (PHENERGAN) 25 MG tablet Take 0.5 tablets (12.5 mg total) by mouth every 6 (six) hours as needed for nausea or vomiting.  30 tablet  0    Review of Systems  Constitutional: Negative for fever and malaise/fatigue.  Gastrointestinal: Positive for nausea and vomiting. Negative for abdominal pain, diarrhea and constipation.  Genitourinary: Negative for dysuria, urgency and frequency.       Neg - vaginal bleeding, discharge, LOF  Neurological: Positive for  weakness. Negative for dizziness and loss of consciousness.   Physical Exam   Blood pressure 124/66, pulse 78, temperature 98.7 F (37.1 C), temperature source Oral, resp. rate 18, height  (1.575 m), weight 145 lb (65.772 kg), last menstrual period 01/11/2014, SpO2 99.00%.  Physical Exam  Constitutional: She is oriented to person, place, and time. She appears well-developed and well-nourished. No distress.  HENT:  Head: Normocephalic.  Cardiovascular: Normal rate.   Respiratory: Effort normal.  GI: Soft. She exhibits no distension and no mass. There is no tenderness. There is no rebound and no guarding.  Neurological: She is alert and oriented to person, place, and time.  Skin: Skin is warm and dry. No erythema.  Psychiatric: She has a normal mood and affect.   Results for orders placed during the hospital encounter of 08/17/14 (from the past 24 hour(s))  URINALYSIS, ROUTINE W REFLEX MICROSCOPIC     Status: Abnormal   Collection Time    08/17/14 11:40 AM      Result Value Ref Range   Color, Urine YELLOW  YELLOW   APPearance HAZY (*) CLEAR   Specific Gravity, Urine >1.030 (*) 1.005 - 1.030   pH 6.0  5.0 - 8.0   Glucose, UA NEGATIVE  NEGATIVE mg/dL   Hgb urine dipstick SMALL (*) NEGATIVE   Bilirubin  Urine NEGATIVE  NEGATIVE   Ketones, ur >80 (*) NEGATIVE mg/dL   Protein, ur 161 (*) NEGATIVE mg/dL   Urobilinogen, UA 0.2  0.0 - 1.0 mg/dL   Nitrite NEGATIVE  NEGATIVE   Leukocytes, UA NEGATIVE  NEGATIVE  URINE MICROSCOPIC-ADD ON     Status: Abnormal   Collection Time    08/17/14 11:40 AM      Result Value Ref Range   Squamous Epithelial / LPF MANY (*) RARE   WBC, UA 3-6  <3 WBC/hpf   Bacteria, UA FEW (*) RARE  CBC WITH DIFFERENTIAL     Status: Abnormal   Collection Time    08/17/14 12:23 PM      Result Value Ref Range   WBC 10.4  4.0 - 10.5 K/uL   RBC 4.29  3.87 - 5.11 MIL/uL   Hemoglobin 11.3 (*) 12.0 - 15.0 g/dL   HCT 09.6 (*) 04.5 - 40.9 %   MCV 82.1  78.0 - 100.0  fL   MCH 26.3  26.0 - 34.0 pg   MCHC 32.1  30.0 - 36.0 g/dL   RDW 81.1  91.4 - 78.2 %   Platelets 260  150 - 400 K/uL   Neutrophils Relative % 77  43 - 77 %   Neutro Abs 8.0 (*) 1.7 - 7.7 K/uL   Lymphocytes Relative 13  12 - 46 %   Lymphs Abs 1.4  0.7 - 4.0 K/uL   Monocytes Relative 10  3 - 12 %   Monocytes Absolute 1.0  0.1 - 1.0 K/uL   Eosinophils Relative 0  0 - 5 %   Eosinophils Absolute 0.0  0.0 - 0.7 K/uL   Basophils Relative 0  0 - 1 %   Basophils Absolute 0.0  0.0 - 0.1 K/uL  COMPREHENSIVE METABOLIC PANEL     Status: Abnormal   Collection Time    08/17/14  1:00 PM      Result Value Ref Range   Sodium 140  137 - 147 mEq/L   Potassium 3.9  3.7 - 5.3 mEq/L   Chloride 102  96 - 112 mEq/L   CO2 12 (*) 19 - 32 mEq/L   Glucose, Bld 103 (*) 70 - 99 mg/dL   BUN 5 (*) 6 - 23 mg/dL   Creatinine, Ser 9.56  0.50 - 1.10 mg/dL   Calcium 9.4  8.4 - 21.3 mg/dL   Total Protein 7.1  6.0 - 8.3 g/dL   Albumin 3.3 (*) 3.5 - 5.2 g/dL   AST 21  0 - 37 U/L   ALT 11  0 - 35 U/L   Alkaline Phosphatase 87  39 - 117 U/L   Total Bilirubin 0.4  0.3 - 1.2 mg/dL   GFR calc non Af Amer >90  >90 mL/min   GFR calc Af Amer >90  >90 mL/min   Anion gap 26 (*) 5 - 15   Fetal Monitoring: Baseline: 130 bpm, moderate variability, + accelerations, no decelerations  Contractions: none  MAU Course  Procedures None  MDM UA, CBC, CMP today 1 liter IV LR with 25 mg Phenergan given in MAU UA shows signs of significant dehydration Additional liter of D5LR given with 20 mg IVPB Pepcid Patient is resting and has not had additional episodes of emesis recently Patient attempted PO intake. No emesis, but continued severe nausea endorsed by the patient Patient given 8 mg IV Zofran Patient reports some improvement in symptoms. Still no additional episodes of emesis in MAU.  Patient declines PO intake at this time Assessment and Plan  A: SIUP at [redacted]w[redacted]d Nausea and vomiting in pregnancy  P: Discharge home Rx  for Phenergan suppositories and Zofran sent to patient's pharmacy Patient advised to follow-up with Dr. Gaynell Face as scheduled or sooner if symptoms worsen Patient may return to MAU as needed or if her condition were to change or worsen  Marny Lowenstein, PA-C  08/17/2014, 2:56 PM

## 2014-08-17 NOTE — Discharge Instructions (Signed)
Morning Sickness °Morning sickness is when you feel sick to your stomach (nauseous) during pregnancy. You may feel sick to your stomach and throw up (vomit). You may feel sick in the morning, but you can feel this way any time of day. Some women feel very sick to their stomach and cannot stop throwing up (hyperemesis gravidarum). °HOME CARE °· Only take medicines as told by your doctor. °· Take multivitamins as told by your doctor. Taking multivitamins before getting pregnant can stop or lessen the harshness of morning sickness. °· Eat dry toast or unsalted crackers before getting out of bed. °· Eat 5 to 6 small meals a day. °· Eat dry and bland foods like rice and baked potatoes. °· Do not drink liquids with meals. Drink between meals. °· Do not eat greasy, fatty, or spicy foods. °· Have someone cook for you if the smell of food causes you to feel sick or throw up. °· If you feel sick to your stomach after taking prenatal vitamins, take them at night or with a snack. °· Eat protein when you need a snack (nuts, yogurt, cheese). °· Eat unsweetened gelatins for dessert. °· Wear a bracelet used for sea sickness (acupressure wristband). °· Go to a doctor that puts thin needles into certain body points (acupuncture) to improve how you feel. °· Do not smoke. °· Use a humidifier to keep the air in your house free of odors. °· Get lots of fresh air. °GET HELP IF: °· You need medicine to feel better. °· You feel dizzy or lightheaded. °· You are losing weight. °GET HELP RIGHT AWAY IF:  °· You feel very sick to your stomach and cannot stop throwing up. °· You pass out (faint). °MAKE SURE YOU: °· Understand these instructions. °· Will watch your condition. °· Will get help right away if you are not doing well or get worse. °Document Released: 01/01/2005 Document Revised: 11/29/2013 Document Reviewed: 05/11/2013 °Exit[employer]® Patient Information ©2015 [employer]. This information is not intended to replace advice given to you by  your health care provider. Make sure you discuss any questions you have with your health care provider. ° °Eating Plan for Hyperemesis Gravidarum °Severe cases of hyperemesis gravidarum can lead to dehydration and malnutrition. The hyperemesis eating plan is one way to lessen the symptoms of nausea and vomiting. It is often used with prescribed medicines to control your symptoms.  °WHAT CAN I DO TO RELIEVE MY SYMPTOMS? °Listen to your body. Everyone is different and has different preferences. Find what works best for you. Some of the following things may help: °· Eat and drink slowly. °· Eat 5-6 small meals daily instead of 3 large meals.   °· Eat crackers before you get out of bed in the morning.   °· Starchy foods are usually well tolerated (such as cereal, toast, bread, potatoes, pasta, rice, and pretzels).   °· Ginger may help with nausea. Add ¼ tsp ground ginger to hot tea or choose ginger tea.   °· Try drinking 100% fruit juice or an electrolyte drink. °· Continue to take your prenatal vitamins as directed by your health care provider. If you are having trouble taking your prenatal vitamins, talk with your health care provider about different options. °· Include at least 1 serving of protein with your meals and snacks (such as meats or poultry, beans, nuts, eggs, or yogurt). Try eating a protein-rich snack before bed (such as cheese and crackers or a half turkey or peanut butter sandwich). °WHAT THINGS SHOULD I   AVOID TO REDUCE MY SYMPTOMS? °The following things may help reduce your symptoms: °· Avoid foods with strong smells. Try eating meals in well-ventilated areas that are free of odors. °· Avoid drinking water or other beverages with meals. Try not to drink anything less than 30 minutes before and after meals. °· Avoid drinking more than 1 cup of fluid at a time. °· Avoid fried or high-fat foods, such as butter and cream sauces. °· Avoid spicy foods. °· Avoid skipping meals the best you can. Nausea can be  more intense on an empty stomach. If you cannot tolerate food at that time, do not force it. Try sucking on ice chips or other frozen items and make up the calories later. °· Avoid lying down within 2 hours after eating. °Document Released: 09/21/2007 Document Revised: 11/29/2013 Document Reviewed: 09/28/2013 °Exit[employer]® Patient Information ©2015 [employer]. This information is not intended to replace advice given to you by your health care provider. Make sure you discuss any questions you have with your health care provider. ° °

## 2014-08-29 ENCOUNTER — Other Ambulatory Visit (HOSPITAL_COMMUNITY): Payer: Self-pay | Admitting: Obstetrics

## 2014-08-29 DIAGNOSIS — R112 Nausea with vomiting, unspecified: Secondary | ICD-10-CM

## 2014-08-29 DIAGNOSIS — R109 Unspecified abdominal pain: Secondary | ICD-10-CM

## 2014-08-31 ENCOUNTER — Encounter (HOSPITAL_COMMUNITY): Payer: Self-pay | Admitting: General Practice

## 2014-08-31 ENCOUNTER — Inpatient Hospital Stay (HOSPITAL_COMMUNITY): Payer: Medicaid Other

## 2014-08-31 ENCOUNTER — Ambulatory Visit (HOSPITAL_COMMUNITY): Admission: RE | Admit: 2014-08-31 | Payer: Medicaid Other | Source: Ambulatory Visit

## 2014-08-31 ENCOUNTER — Inpatient Hospital Stay (HOSPITAL_COMMUNITY)
Admission: AD | Admit: 2014-08-31 | Discharge: 2014-09-06 | DRG: 774 | Disposition: A | Discharge status: Home / Self Care | Payer: Medicaid Other | Source: Ambulatory Visit | Attending: Obstetrics | Admitting: Obstetrics

## 2014-08-31 DIAGNOSIS — R748 Abnormal levels of other serum enzymes: Secondary | ICD-10-CM | POA: Diagnosis present

## 2014-08-31 DIAGNOSIS — Z8249 Family history of ischemic heart disease and other diseases of the circulatory system: Secondary | ICD-10-CM

## 2014-08-31 DIAGNOSIS — E43 Unspecified severe protein-calorie malnutrition: Secondary | ICD-10-CM | POA: Insufficient documentation

## 2014-08-31 DIAGNOSIS — O1410 Severe pre-eclampsia, unspecified trimester: Secondary | ICD-10-CM | POA: Diagnosis present

## 2014-08-31 DIAGNOSIS — K219 Gastro-esophageal reflux disease without esophagitis: Secondary | ICD-10-CM | POA: Diagnosis present

## 2014-08-31 DIAGNOSIS — O2510 Malnutrition in pregnancy, unspecified trimester: Secondary | ICD-10-CM

## 2014-08-31 DIAGNOSIS — O1423 HELLP syndrome (HELLP), third trimester: Secondary | ICD-10-CM

## 2014-08-31 DIAGNOSIS — N179 Acute kidney failure, unspecified: Secondary | ICD-10-CM | POA: Diagnosis present

## 2014-08-31 DIAGNOSIS — Z3490 Encounter for supervision of normal pregnancy, unspecified, unspecified trimester: Secondary | ICD-10-CM

## 2014-08-31 DIAGNOSIS — O1414 Severe pre-eclampsia complicating childbirth: Principal | ICD-10-CM | POA: Diagnosis present

## 2014-08-31 HISTORY — DX: HELLP syndrome (HELLP), unspecified trimester: O14.20

## 2014-08-31 LAB — COMPREHENSIVE METABOLIC PANEL
ALBUMIN: 3.6 g/dL (ref 3.5–5.2)
ALK PHOS: 152 U/L — AB (ref 39–117)
ALT: 332 U/L — ABNORMAL HIGH (ref 0–35)
AST: 212 U/L — ABNORMAL HIGH (ref 0–37)
Anion gap: 26 — ABNORMAL HIGH (ref 5–15)
BILIRUBIN TOTAL: 0.5 mg/dL (ref 0.3–1.2)
BUN: 34 mg/dL — AB (ref 6–23)
CHLORIDE: 88 meq/L — AB (ref 96–112)
CO2: 26 mEq/L (ref 19–32)
Calcium: 9.7 mg/dL (ref 8.4–10.5)
Creatinine, Ser: 3.13 mg/dL — ABNORMAL HIGH (ref 0.50–1.10)
GFR calc non Af Amer: 19 mL/min — ABNORMAL LOW (ref >90)
GFR, EST AFRICAN AMERICAN: 22 mL/min — AB (ref >90)
GLUCOSE: 98 mg/dL (ref 70–99)
POTASSIUM: 3.1 meq/L — AB (ref 3.7–5.3)
Sodium: 140 mEq/L (ref 137–147)
Total Protein: 7.8 g/dL (ref 6.0–8.3)

## 2014-08-31 LAB — CBC
HCT: 47 % — ABNORMAL HIGH (ref 36.0–46.0)
Hemoglobin: 15.2 g/dL — ABNORMAL HIGH (ref 12.0–15.0)
MCH: 25.5 pg — ABNORMAL LOW (ref 26.0–34.0)
MCHC: 32.3 g/dL (ref 30.0–36.0)
MCV: 79 fL (ref 78.0–100.0)
Platelets: 184 10*3/uL (ref 150–400)
RBC: 5.95 MIL/uL — ABNORMAL HIGH (ref 3.87–5.11)
RDW: 14.4 % (ref 11.5–15.5)
WBC: 4.8 10*3/uL (ref 4.0–10.5)

## 2014-08-31 LAB — LIPASE, BLOOD: LIPASE: 81 U/L — AB (ref 11–59)

## 2014-08-31 LAB — AMYLASE: Amylase: 82 U/L (ref 0–105)

## 2014-08-31 MED ORDER — POTASSIUM CHLORIDE 2 MEQ/ML IV SOLN
INTRAVENOUS | Status: DC
  Administered 2014-09-01: via INTRAVENOUS
  Filled 2014-08-31 (×2): qty 1000

## 2014-08-31 MED ORDER — POTASSIUM CHLORIDE IN NACL 40-0.9 MEQ/L-% IV SOLN: INTRAVENOUS | Status: DC

## 2014-08-31 MED ORDER — ONDANSETRON HCL 4 MG/2ML IJ SOLN: 4.0000 mg | Freq: Four times a day (QID) | INTRAMUSCULAR | Status: DC | PRN

## 2014-08-31 MED ORDER — SODIUM CHLORIDE 0.9 % IV SOLN
INTRAVENOUS | Status: DC
  Administered 2014-08-31: 23:00:00 via INTRAVENOUS
  Filled 2014-08-31: qty 1000

## 2014-08-31 MED ORDER — SODIUM CHLORIDE 0.9 % IV BOLUS (SEPSIS): 1000.0000 mL | Freq: Once | INTRAVENOUS | Status: DC

## 2014-08-31 MED ORDER — ZOLPIDEM TARTRATE 5 MG PO TABS: 5.0000 mg | ORAL_TABLET | Freq: Every evening | ORAL | Status: DC | PRN

## 2014-08-31 MED ORDER — CALCIUM CARBONATE ANTACID 500 MG PO CHEW
2.0000 | CHEWABLE_TABLET | ORAL | Status: DC | PRN
  Administered 2014-08-31 – 2014-09-01 (×2): 400 mg via ORAL
  Filled 2014-08-31: qty 1
  Filled 2014-08-31: qty 2

## 2014-08-31 MED ORDER — PRENATAL MULTIVITAMIN CH: 1.0000 | ORAL_TABLET | Freq: Every day | ORAL | Status: DC

## 2014-08-31 NOTE — MAU Provider Note (Signed)
History     CSN: [id number]  Arrival date and time: 08/31/14 1850   None     Chief Complaint  Patient presents with  . Hyperemesis Gravidarum   HPI  Pt is a 28 yo G4P0030 at [redacted]w[redacted]d wks IUP here with report of nausea and vomiting throughout pregnancy.  N&V has been assoaciated with  acid reflux this week. Also having intermittent mid abdominal pain. Denies LOF or vaginal bleeding. Positive fetal movement. Seen by Dr. Gaynell Face on Tuesday & was told to come to the hospital this evening for a gall bladder ultrasound.  Dr. Gaynell Face concerned about the increased weight loss during pregnancy.     Past Medical History  Diagnosis Date  . Medical history non-contributory     Past Surgical History  Procedure Laterality Date  . Dilation and curettage of uterus    . Induced abortion      Family History  Problem Relation Age of Onset  . Heart disease Father     History  Substance Use Topics  . Smoking status: Never Smoker   . Smokeless tobacco: Not on file  . Alcohol Use: Yes    Allergies: No Known Allergies  Prescriptions prior to admission  Medication Sig Dispense Refill  . acetaminophen (TYLENOL) 500 MG tablet Take 1,000 mg by mouth every 6 (six) hours as needed for mild pain or moderate pain.       . calcium carbonate (TUMS - DOSED IN MG ELEMENTAL CALCIUM) 500 MG chewable tablet Chew 2 tablets by mouth 3 (three) times daily as needed for indigestion or heartburn.      . ondansetron (ZOFRAN) 4 MG tablet Take 1 tablet (4 mg total) by mouth every 6 (six) hours.  12 tablet  0  . Prenatal Vit-Fe Fumarate-FA (PRENATAL MULTIVITAMIN) TABS tablet Take 1 tablet by mouth daily at 12 noon.      . promethazine (PHENERGAN) 25 MG suppository Place 1 suppository (25 mg total) rectally every 6 (six) hours as needed for nausea or vomiting.  12 each  0  . promethazine (PHENERGAN) 25 MG tablet Take 0.5 tablets (12.5 mg total) by mouth every 6 (six) hours as needed for nausea or vomiting.  30  tablet  0  . ranitidine (ZANTAC) 75 MG tablet Take 75 mg by mouth 2 (two) times daily as needed for heartburn.        Review of Systems  Constitutional: Positive for weight loss. Negative for fever and chills.  Respiratory: Negative for cough and shortness of breath.   Gastrointestinal: Positive for nausea, vomiting and abdominal pain (acid reflux).  All other systems reviewed and are negative.  Physical Exam   Blood pressure 122/83, pulse 112, temperature 98.8 F (37.1 C), temperature source Oral, resp. rate 18, height  (1.575 m), last menstrual period 01/11/2014, SpO2 100.00%.  Physical Exam  Constitutional: She is oriented to person, place, and time. She appears well-developed and well-nourished.  HENT:  Head: Normocephalic.  Neck: Normal range of motion. Neck supple.  Cardiovascular: Normal rate, regular rhythm and normal heart sounds.   Respiratory: Effort normal and breath sounds normal.  GI: Soft. There is no tenderness.  Genitourinary: No bleeding around the vagina.  Musculoskeletal: Normal range of motion. She exhibits no edema.  Neurological: She is alert and oriented to person, place, and time.  Skin: Skin is warm and dry.   FHR 120's, +accels Toco none MAU Course  Procedures Results for orders placed during the hospital encounter of 08/31/14 (from the  past 24 hour(s))  CBC     Status: Abnormal   Collection Time    08/31/14  7:30 PM      Result Value Ref Range   WBC 4.8  4.0 - 10.5 K/uL   RBC 5.95 (*) 3.87 - 5.11 MIL/uL   Hemoglobin 15.2 (*) 12.0 - 15.0 g/dL   HCT 16.1 (*) 09.6 - 04.5 %   MCV 79.0  78.0 - 100.0 fL   MCH 25.5 (*) 26.0 - 34.0 pg   MCHC 32.3  30.0 - 36.0 g/dL   RDW 40.9  81.1 - 91.4 %   Platelets 184  150 - 400 K/uL  COMPREHENSIVE METABOLIC PANEL     Status: Abnormal   Collection Time    08/31/14  7:30 PM      Result Value Ref Range   Sodium 140  137 - 147 mEq/L   Potassium 3.1 (*) 3.7 - 5.3 mEq/L   Chloride 88 (*) 96 - 112 mEq/L    CO2 26  19 - 32 mEq/L   Glucose, Bld 98  70 - 99 mg/dL   BUN 34 (*) 6 - 23 mg/dL   Creatinine, Ser 7.82 (*) 0.50 - 1.10 mg/dL   Calcium 9.7  8.4 - 95.6 mg/dL   Total Protein 7.8  6.0 - 8.3 g/dL   Albumin 3.6  3.5 - 5.2 g/dL   AST 213 (*) 0 - 37 U/L   ALT 332 (*) 0 - 35 U/L   Alkaline Phosphatase 152 (*) 39 - 117 U/L   Total Bilirubin 0.5  0.3 - 1.2 mg/dL   GFR calc non Af Amer 19 (*) >90 mL/min   GFR calc Af Amer 22 (*) >90 mL/min   Anion gap 26 (*) 5 - 15  LIPASE, BLOOD     Status: Abnormal   Collection Time    08/31/14  7:30 PM      Result Value Ref Range   Lipase 81 (*) 11 - 59 U/L  AMYLASE     Status: None   Collection Time    08/31/14  7:30 PM      Result Value Ref Range   Amylase 82  0 - 105 U/L   Ultrasound: FINDINGS:  Gallbladder:  Prominent hyperechoic material within the gallbladder lumen likely  reflect stones and/ or sludge. Gallbladder wall measure within  normal limits at 2 mm. No free pericholecystic fluid. A positive  sonographic Murphy sign was elicited on exam.  Common bile duct:  Diameter: 2 mm.  Liver:  The liver is somewhat diffusely hypoechoic in appearance with  prominence of the portal triads, which may reflect acute hepatitis.  IMPRESSION:  1. Small stones and/or sludge within the gallbladder lumen with  positive sonographic Murphy sign. No biliary dilatation.  2. Diffusely hypoechoic echotexture of the liver with prominence of  the portal triads, which can be seen with acute hepatitis.  Correlation with LFTs recommended. Given these findings in the  setting of pregnancy, possible HELLP syndrome could be considered.   Assessment and Plan  28 yo G4P0030 at [redacted]w[redacted]d wks IUP Elevated Liver Enzymes Suspected Acute Renal Failure Abnormal abdominal ultrasound - liver and gallbladder Category I FHR Tracomg  Plan: Consulted with Dr. Gaynell Face > reviewed HPI/exam/labs/vitals/ultrasound  Admit to antenatal MFM consult in AM IV Bolus 24 hour urine  collection Hepatitis panel Repeat [facility] in AM  KARIM, [facility] N 08/31/2014, 7:54 PM

## 2014-08-31 NOTE — Progress Notes (Signed)
Clarified orders with MD. Orders  Changed from NS with of Potassium  to  LR with of Potassium at 127ml/hr.  Zofran   Q6 prn nausea.  Regular diet orderd.  All other orders reviewed.

## 2014-08-31 NOTE — MAU Note (Signed)
Having some n/v & acid reflux this week. Also having intermittent mid abdominal pain. Denies LOF or vaginal bleeding. Positive fetal movement. States was seen by Dr. Gaynell Face on Tuesday & was told to come to the hospital this evening for a gall bladder ultrasound.

## 2014-09-01 ENCOUNTER — Encounter (HOSPITAL_COMMUNITY): Payer: Self-pay | Admitting: *Deleted

## 2014-09-01 ENCOUNTER — Inpatient Hospital Stay (HOSPITAL_COMMUNITY): Payer: Medicaid Other

## 2014-09-01 DIAGNOSIS — Z3490 Encounter for supervision of normal pregnancy, unspecified, unspecified trimester: Secondary | ICD-10-CM

## 2014-09-01 DIAGNOSIS — E43 Unspecified severe protein-calorie malnutrition: Secondary | ICD-10-CM | POA: Insufficient documentation

## 2014-09-01 LAB — COMPREHENSIVE METABOLIC PANEL
ALT: 278 U/L — AB (ref 0–35)
AST: 183 U/L — AB (ref 0–37)
Albumin: 2.9 g/dL — ABNORMAL LOW (ref 3.5–5.2)
Alkaline Phosphatase: 125 U/L — ABNORMAL HIGH (ref 39–117)
Anion gap: 18 — ABNORMAL HIGH (ref 5–15)
BILIRUBIN TOTAL: 0.5 mg/dL (ref 0.3–1.2)
BUN: 34 mg/dL — ABNORMAL HIGH (ref 6–23)
CHLORIDE: 92 meq/L — AB (ref 96–112)
CO2: 26 mEq/L (ref 19–32)
Calcium: 9.2 mg/dL (ref 8.4–10.5)
Creatinine, Ser: 2.73 mg/dL — ABNORMAL HIGH (ref 0.50–1.10)
GFR calc Af Amer: 26 mL/min — ABNORMAL LOW (ref >90)
GFR calc non Af Amer: 22 mL/min — ABNORMAL LOW (ref >90)
Glucose, Bld: 104 mg/dL — ABNORMAL HIGH (ref 70–99)
Potassium: 3.3 mEq/L — ABNORMAL LOW (ref 3.7–5.3)
SODIUM: 136 meq/L — AB (ref 137–147)
Total Protein: 6.5 g/dL (ref 6.0–8.3)

## 2014-09-01 LAB — CBC
HCT: 36 % (ref 36.0–46.0)
HEMATOCRIT: 36.9 % (ref 36.0–46.0)
Hemoglobin: 12.1 g/dL (ref 12.0–15.0)
Hemoglobin: 11.9 g/dL — ABNORMAL LOW (ref 12.0–15.0)
MCH: 25.9 pg — ABNORMAL LOW (ref 26.0–34.0)
MCH: 25.8 pg — AB (ref 26.0–34.0)
MCHC: 32.8 g/dL (ref 30.0–36.0)
MCHC: 33.1 g/dL (ref 30.0–36.0)
MCV: 78.8 fL (ref 78.0–100.0)
MCV: 78.1 fL (ref 78.0–100.0)
PLATELETS: 179 10*3/uL (ref 150–400)
PLATELETS: 183 10*3/uL (ref 150–400)
RBC: 4.68 MIL/uL (ref 3.87–5.11)
RBC: 4.61 MIL/uL (ref 3.87–5.11)
RDW: 14.5 % (ref 11.5–15.5)
RDW: 14.2 % (ref 11.5–15.5)
WBC: 6.1 10*3/uL (ref 4.0–10.5)
WBC: 7.1 10*3/uL (ref 4.0–10.5)

## 2014-09-01 LAB — URINALYSIS, ROUTINE W REFLEX MICROSCOPIC
GLUCOSE, UA: NEGATIVE mg/dL
KETONES UR: 15 mg/dL — AB
Nitrite: NEGATIVE
PROTEIN: 100 mg/dL — AB
Specific Gravity, Urine: 1.025 (ref 1.005–1.030)
UROBILINOGEN UA: 4 mg/dL — AB (ref 0.0–1.0)
pH: 5.5 (ref 5.0–8.0)

## 2014-09-01 LAB — TYPE AND SCREEN
ABO/RH(D): B POS
Antibody Screen: NEGATIVE

## 2014-09-01 LAB — RPR

## 2014-09-01 LAB — URINE MICROSCOPIC-ADD ON

## 2014-09-01 LAB — APTT: APTT: 24 s (ref 24–37)

## 2014-09-01 LAB — PROTIME-INR
INR: 1 (ref 0.00–1.49)
Prothrombin Time: 13.2 seconds (ref 11.6–15.2)

## 2014-09-01 LAB — HEPATITIS PANEL, ACUTE
HCV Ab: NEGATIVE
Hep A IgM: NONREACTIVE
Hep B C IgM: NONREACTIVE
Hepatitis B Surface Ag: NEGATIVE

## 2014-09-01 LAB — SAVE SMEAR: Smear Review: NONE SEEN

## 2014-09-01 LAB — LACTATE DEHYDROGENASE: LDH: 356 U/L — ABNORMAL HIGH (ref 94–250)

## 2014-09-01 MED ORDER — TERBUTALINE SULFATE 1 MG/ML IJ SOLN: 0.2500 mg | Freq: Once | INTRAMUSCULAR | Status: AC | PRN

## 2014-09-01 MED ORDER — LACTATED RINGERS IV BOLUS (SEPSIS)
500.0000 mL | Freq: Once | INTRAVENOUS | Status: AC
  Administered 2014-09-01: 500 mL via INTRAVENOUS

## 2014-09-01 MED ORDER — FLEET ENEMA 7-19 GM/118ML RE ENEM: 1.0000 | ENEMA | RECTAL | Status: DC | PRN

## 2014-09-01 MED ORDER — LIDOCAINE HCL (PF) 1 % IJ SOLN
30.0000 mL | INTRAMUSCULAR | Status: DC | PRN
  Filled 2014-09-01: qty 30

## 2014-09-01 MED ORDER — OXYTOCIN 40 UNITS IN LACTATED RINGERS INFUSION - SIMPLE MED
1.0000 m[IU]/min | INTRAVENOUS | Status: DC
  Administered 2014-09-01: 2 m[IU]/min via INTRAVENOUS
  Filled 2014-09-01: qty 1000

## 2014-09-01 MED ORDER — BETAMETHASONE SOD PHOS & ACET 6 (3-3) MG/ML IJ SUSP
12.0000 mg | Freq: Once | INTRAMUSCULAR | Status: AC
  Administered 2014-09-01: 12 mg via INTRAMUSCULAR
  Filled 2014-09-01: qty 2

## 2014-09-01 MED ORDER — ONDANSETRON HCL 4 MG/2ML IJ SOLN: 4.0000 mg | Freq: Four times a day (QID) | INTRAMUSCULAR | Status: DC | PRN

## 2014-09-01 MED ORDER — BUTORPHANOL TARTRATE 1 MG/ML IJ SOLN: 1.0000 mg | INTRAMUSCULAR | Status: DC | PRN

## 2014-09-01 MED ORDER — LACTATED RINGERS IV SOLN
INTRAVENOUS | Status: DC
  Administered 2014-09-01: 10:00:00 via INTRAVENOUS

## 2014-09-01 MED ORDER — ACETAMINOPHEN 325 MG PO TABS: 650.0000 mg | ORAL_TABLET | ORAL | Status: DC | PRN

## 2014-09-01 MED ORDER — OXYTOCIN 40 UNITS IN LACTATED RINGERS INFUSION - SIMPLE MED
62.5000 mL/h | INTRAVENOUS | Status: DC
  Administered 2014-09-02: 4 m[IU]/min via INTRAVENOUS
  Administered 2014-09-02: 8 m[IU]/min via INTRAVENOUS
  Administered 2014-09-02: 6 m[IU]/min via INTRAVENOUS
  Administered 2014-09-02: 500 mL/h via INTRAVENOUS

## 2014-09-01 MED ORDER — CITRIC ACID-SODIUM CITRATE 334-500 MG/5ML PO SOLN
30.0000 mL | ORAL | Status: DC | PRN
  Administered 2014-09-01: 30 mL via ORAL
  Filled 2014-09-01: qty 15

## 2014-09-01 MED ORDER — OXYCODONE-ACETAMINOPHEN 5-325 MG PO TABS
2.0000 | ORAL_TABLET | ORAL | Status: DC | PRN
Start: 2014-09-01 — End: 2014-09-02

## 2014-09-01 MED ORDER — BOOST / RESOURCE BREEZE PO LIQD
237.0000 mL | Freq: Three times a day (TID) | ORAL | Status: DC
  Filled 2014-09-01 (×3): qty 1

## 2014-09-01 MED ORDER — OXYTOCIN 40 UNITS IN LACTATED RINGERS INFUSION - SIMPLE MED
1.0000 m[IU]/min | INTRAVENOUS | Status: DC
  Administered 2014-09-01: 2 m[IU]/min via INTRAVENOUS

## 2014-09-01 MED ORDER — SODIUM CHLORIDE 0.9 % IV SOLN
2.0000 g | Freq: Four times a day (QID) | INTRAVENOUS | Status: DC
  Administered 2014-09-01 – 2014-09-02 (×5): 2 g via INTRAVENOUS
  Filled 2014-09-01 (×8): qty 2000

## 2014-09-01 MED ORDER — LACTATED RINGERS IV SOLN
INTRAVENOUS | Status: DC
  Administered 2014-09-01 – 2014-09-02 (×2): via INTRAVENOUS

## 2014-09-01 MED ORDER — LACTATED RINGERS IV SOLN
500.0000 mL | INTRAVENOUS | Status: DC | PRN
  Administered 2014-09-02: 250 mL via INTRAVENOUS

## 2014-09-01 MED ORDER — OXYTOCIN BOLUS FROM INFUSION: 500.0000 mL | INTRAVENOUS | Status: DC

## 2014-09-01 MED ORDER — OXYCODONE-ACETAMINOPHEN 5-325 MG PO TABS: 1.0000 | ORAL_TABLET | ORAL | Status: DC | PRN

## 2014-09-01 NOTE — Progress Notes (Signed)
Antenatal Nutrition Assessment:  Currently  33 2/[redacted] weeks gestation, with n/v, weight loss, gallstones. Height  62 "  Weight 132 lbs  pre-pregnancy weight 160-165 lbs .  Pre-pregnancy  BMI 29.3  IBW 110 lbs Total weight gain 0.lbs Weight gain goals 11-20 lbs  Pt with a 17.5 % loss of usual/pre-pregnancy weight. Putting her at risk for malnutrition. Meets ASPEN criteria for Dx of severe malnutrition based on a > 7.5% loss of usual weight and po intake < 50 % of est needs for > 5 days  Estimated needs: 1800-2000 kcal/day, 60-70 grams protein/day, 2.2 liters fluid/day  Heart healty diet (low fat)  Will order resource boost breeze as supplement Current diet prescription will provide for increased needs.  Nutrition related labs  CMP     Component Value Date/Time   NA 136* 09/01/2014 0525   K 3.3* 09/01/2014 0525   CL 92* 09/01/2014 0525   CO2 26 09/01/2014 0525   GLUCOSE 104* 09/01/2014 0525   BUN 34* 09/01/2014 0525   CREATININE 2.73* 09/01/2014 0525   CALCIUM 9.2 09/01/2014 0525   PROT 6.5 09/01/2014 0525   ALBUMIN 2.9* 09/01/2014 0525   AST 183* 09/01/2014 0525   ALT 278* 09/01/2014 0525   ALKPHOS 125* 09/01/2014 0525   BILITOT 0.5 09/01/2014 0525   GFRNONAA 22* 09/01/2014 0525   GFRAA 26* 09/01/2014 0525     Nutrition Dx: Inadequate oral intake related to n/v as evidenced by weight loss   No educational needs assessed at this time.  Amy [name] LDN Neonatal Nutrition Support Specialist/RD III Pager [phone]

## 2014-09-01 NOTE — Consult Note (Signed)
MATERNAL FETAL MEDICINE CONSULT  Patient Name: Amy [name] Medical Record Number:  [id number] Date of Birth: 03-11-1986 Requesting Physician Name:  Amy Cosier, MD Date of Service: 09/01/2014  Chief Complaint Elevated LFT's and acute renal failure  History of Present Illness Amy [name] was seen today secondary to elevated LFT's and acute renal failure at the request of Amy Cosier, MD.  The patient is a 28 y.o. G20P0030,at [redacted]w[redacted]d with an EDD of 10/18/2014, by Last Menstrual Period dating method.  Amy [name] began having nausea and vomiting several weeks ago.  In the last few days she has had difficulty keeping fluids down.  She feels weak, but denies headache, visual changes, RUQ pain, or swelling.  She has been normotensive throughout the last two weeks and her BP since admission has been normal as well.  Review of Systems Pertinent items are noted in HPI.  Patient History OB History  Gravida Para Term Preterm AB SAB TAB Ectopic Multiple Living  0    # Outcome Date GA Lbr Len/2nd Weight Sex Delivery Anes PTL Lv  4 CUR           3 TAB           2 TAB           1 SAB         N      Past Medical History  Diagnosis Date  . Medical history non-contributory     Past Surgical History  Procedure Laterality Date  . Dilation and curettage of uterus    . Induced abortion      History   Social History  . Marital Status: Single    Spouse Name: N/A    Number of Children: N/A  . Years of Education: N/A   Social History Main Topics  . Smoking status: Never Smoker   . Smokeless tobacco: None  . Alcohol Use: No  . Drug Use: No  . Sexual Activity: Yes   Other Topics Concern  . None   Social History Narrative  . None    Family History  Problem Relation Age of Onset  . Heart disease Father    In addition, the patient has no family history of mental retardation, birth defects, or genetic diseases.  Physical Examination Filed Vitals:    09/01/14 0307  BP: 118/82  Pulse: 109  Temp: 98.2 F (36.8 C)  Resp: 16   General appearance - alert, well appearing, and in no distress Chest - clear to auscultation, no wheezes, rales or rhonchi, symmetric air entry Heart - normal rate, regular rhythm, normal S1, S2, no murmurs, rubs, clicks or gallops Abdomen - soft, nontender, nondistended, no masses or organomegaly Extremities - no pedal edema noted  Assessment and Recommendations 1.  Atypical HELLP syndrome.  Although Amy [name] is normotensive, this is seen in approximately 10% of HELLP cases.  Her most striking findings are the LFT elevation and markedly elevated creatinine (most recently 3.13).  The involvement of two organ systems suggests a more systemic disorder rather than one restricted to a single organ system, such as acute infectious hepatitis (such as Hepatitis A, B, or C which have been excluded).  Alternative diagnoses include acute fatty liver of pregnancy or cholecystitis or choledocholithiasis.  While the RUQ ultrasound does demonstrate significant cholelithiasis there is no evidence of gallbladder wall thickening or pericholecystic fluid arguing against cholecystitis.  There is also no intrahepatic duct  dilation or any laboratory evidence of cholestasis which is inconsistent with choledocholithasis.  As Amy [name] has a normal glucose and did not demonstrate fatty infiltration of the liver on ultrasound, acute fatty liver of pregnancy is also unlikely.  In addition, the liver inflammation seen on ultrasound is more consistent with HELLP than with acute fatty liver of pregnancy.  In addition, these alternative diagnosis would also not explain the substantial acute renal failure Amy [name] is experiencing.  Given the severity of Amy [name]'s lab findings I recommend delivery at this time.  I would administer her first dose of betamethasone immediately and give an accelerated course of steroids with the second dose 12 hours after  the first.  Amy [name] fetus is cephalic so an induction of labor is possible.  However, the fetus is growth restricted and has elevated umbilical artery Dopplers.  Thus, there is a possibility that the fetus will not tolerate labor.  I recommend performing a CST if negative an IOL can be begun.  If the CST is positive or there are other concerns a cesarean section should be performed in 24 hours, i.e. 12 hours after the accelerated second dose of betamethasone.  If there is an acute deterioration of either mother or fetus I recommend immediate cesarean delivery.   Thank you for referring Amy [name] to the [facility].  Please do not hesitate to contact us with questions.   Amy Fendt, MD

## 2014-09-01 NOTE — Progress Notes (Signed)
Spoke with Dr Tamela Oddi about POC. Wants Pitocin stopped until 2200 then resumed per order.  Additional labs ordered. Pt informed of POC. Will continue to monitor.

## 2014-09-01 NOTE — H&P (Signed)
This is Dr. Francoise Ceo dictating the history and physical on blank blank she's a 28 year old gravida 4 para 0030 at 47 weeks and 2 days EDC 15 patient has had a history of intermittent nausea and vomiting for the past 2 weeks she's felt lethargic and she's also had him a significant weight loss she's been on anti-emetics and she was seen on Tuesday this with a component weight loss since previous week and a him schedule for gallbladder ultrasound which she came in last night had a gallbladder ultrasound which showed the gallstones possibly hepatitis and and there output since last night the symptoms cc and a urine she has no pain no complaints no nausea no vomiting her liver enzymes are markedly elevated glucose on for her creatinine on admission was 3.3 note to 0.73 floor you potassium 3.3 after the potassium last night platelets 184 hemoglobin 15.2 patient denies drug use or exposure to anyone with hepatitis MFM consult today Past surgical history negative Past medical history no complaints him to plus week History negative System review negative HEENT negative nonicteric sclera Breasts negative Heart regular rhythm no also developed Lungs clear to P&A Abdomen 32 week size Pelvic deferred Extremities negative and

## 2014-09-01 NOTE — Consult Note (Signed)
Neonatology Consult to Antenatal Patient:  I was asked by Dr. Gaynell Face to see this patient in order to provide antenatal counseling due to IOL at 33 2/7 weeks due to abnormal liver and renal function tests.  Ms. Michalski was admitted yesterday at 47 1/[redacted] weeks GA. She has had hyperemesis gravidarum with recent weight loss, but the fetus has continued to grow normally. She now has elevated LFTs and abnormal renal function. She is currently not having active labor. She is getting BMZ and IV Ampicillin. Induction of labor is planned today.  I spoke with the patient with 2 female relatives/friends present. We discussed the expectation of delivery within the next 24 hours, including usual DR management, possible respiratory complications and need for support, IV access, feedings (mother desires breast feeding, which was encouraged), LOS, Mortality and Morbidity, and long term outcomes. She had a few questions, which I answered. I offered a NICU tour to any interested family members and would be glad to come back if she has more questions later.  Thank you for asking me to see this patient.  Doretha Sou, MD Neonatologist  The total length of face-to-face or floor/unit time for this encounter was 20 minutes. Counseling and/or coordination of care was 15 minutes of the above.

## 2014-09-01 NOTE — Progress Notes (Signed)
Pt felt leakage of fluid. Small amount of bleeding noted. SVE same as previous exam. Fern negative.  Pt states contractions are becoming stronger and more uncomfortable.  Informed pt to let me know if feeling of leaking fluid continues. Will continue to monitor.

## 2014-09-01 NOTE — Progress Notes (Signed)
Discussed with Dr. Gaynell Face Urine output since admission- 70ml.  Lab work reviewed. No orders received- Pt to see MFM this am.

## 2014-09-02 ENCOUNTER — Encounter (HOSPITAL_COMMUNITY): Payer: Medicaid Other | Admitting: Anesthesiology

## 2014-09-02 ENCOUNTER — Inpatient Hospital Stay (HOSPITAL_COMMUNITY): Payer: Medicaid Other | Admitting: Anesthesiology

## 2014-09-02 ENCOUNTER — Encounter (HOSPITAL_COMMUNITY): Payer: Self-pay | Admitting: *Deleted

## 2014-09-02 DIAGNOSIS — O1410 Severe pre-eclampsia, unspecified trimester: Secondary | ICD-10-CM | POA: Diagnosis present

## 2014-09-02 LAB — CBC
HEMATOCRIT: 36.5 % (ref 36.0–46.0)
HEMATOCRIT: 37.3 % (ref 36.0–46.0)
HEMOGLOBIN: 12.2 g/dL (ref 12.0–15.0)
Hemoglobin: 12 g/dL (ref 12.0–15.0)
MCH: 25.8 pg — ABNORMAL LOW (ref 26.0–34.0)
MCH: 25.9 pg — AB (ref 26.0–34.0)
MCHC: 32.9 g/dL (ref 30.0–36.0)
MCHC: 32.7 g/dL (ref 30.0–36.0)
MCV: 78.3 fL (ref 78.0–100.0)
MCV: 79.2 fL (ref 78.0–100.0)
Platelets: 194 10*3/uL (ref 150–400)
Platelets: 194 10*3/uL (ref 150–400)
RBC: 4.66 MIL/uL (ref 3.87–5.11)
RBC: 4.71 MIL/uL (ref 3.87–5.11)
RDW: 14.4 % (ref 11.5–15.5)
RDW: 14.3 % (ref 11.5–15.5)
WBC: 9.9 10*3/uL (ref 4.0–10.5)
WBC: 15.3 10*3/uL — ABNORMAL HIGH (ref 4.0–10.5)

## 2014-09-02 LAB — URINALYSIS, ROUTINE W REFLEX MICROSCOPIC
Glucose, UA: NEGATIVE mg/dL
Ketones, ur: 15 mg/dL — AB
Nitrite: NEGATIVE
Protein, ur: 30 mg/dL — AB
SPECIFIC GRAVITY, URINE: 1.015 (ref 1.005–1.030)
UROBILINOGEN UA: 2 mg/dL — AB (ref 0.0–1.0)
pH: 6 (ref 5.0–8.0)

## 2014-09-02 LAB — MAGNESIUM
MAGNESIUM: 6.8 mg/dL — AB (ref 1.5–2.5)
MAGNESIUM: 5.7 mg/dL — AB (ref 1.5–2.5)

## 2014-09-02 LAB — CORD BLOOD GAS (ARTERIAL)

## 2014-09-02 LAB — COMPREHENSIVE METABOLIC PANEL
ALBUMIN: 2.8 g/dL — AB (ref 3.5–5.2)
ALT: 333 U/L — ABNORMAL HIGH (ref 0–35)
ANION GAP: 18 — AB (ref 5–15)
AST: 264 U/L — ABNORMAL HIGH (ref 0–37)
Alkaline Phosphatase: 121 U/L — ABNORMAL HIGH (ref 39–117)
BUN: 28 mg/dL — AB (ref 6–23)
CO2: 28 mEq/L (ref 19–32)
CREATININE: 1.68 mg/dL — AB (ref 0.50–1.10)
Calcium: 9.1 mg/dL (ref 8.4–10.5)
Chloride: 94 mEq/L — ABNORMAL LOW (ref 96–112)
GFR calc Af Amer: 46 mL/min — ABNORMAL LOW (ref >90)
GFR calc non Af Amer: 40 mL/min — ABNORMAL LOW (ref >90)
Glucose, Bld: 126 mg/dL — ABNORMAL HIGH (ref 70–99)
Potassium: 3.6 mEq/L — ABNORMAL LOW (ref 3.7–5.3)
Sodium: 140 mEq/L (ref 137–147)
TOTAL PROTEIN: 6.3 g/dL (ref 6.0–8.3)
Total Bilirubin: 0.6 mg/dL (ref 0.3–1.2)

## 2014-09-02 LAB — PROTEIN, URINE, 24 HOUR
COLLECTION INTERVAL-UPROT: 24 h
PROTEIN, URINE: 72 mg/dL — AB (ref 5–24)
Protein, 24H Urine: 288 mg/d — ABNORMAL HIGH (ref <150)
URINE TOTAL VOLUME-UPROT: 400 mL

## 2014-09-02 LAB — URINE MICROSCOPIC-ADD ON

## 2014-09-02 LAB — LACTATE DEHYDROGENASE: LDH: 390 U/L — AB (ref 94–250)

## 2014-09-02 LAB — CREATININE CLEARANCE, URINE, 24 HOUR
CREAT CLEAR: 53 mL/min — AB (ref 75–115)
CREATININE 24H UR: 1378 mg/d (ref 700–1800)
CREATININE: 1.8 mg/dL — AB (ref 0.50–1.10)
Collection Interval-CRCL: 24 hours
Creatinine, Urine: 344.57 mg/dL
Urine Total Volume-CRCL: 400 mL

## 2014-09-02 LAB — MRSA PCR SCREENING: MRSA BY PCR: NEGATIVE

## 2014-09-02 LAB — ABO/RH: ABO/RH(D): B POS

## 2014-09-02 MED ORDER — ZOLPIDEM TARTRATE 5 MG PO TABS: 5.0000 mg | ORAL_TABLET | Freq: Every evening | ORAL | Status: DC | PRN

## 2014-09-02 MED ORDER — MAGNESIUM SULFATE BOLUS VIA INFUSION
4.0000 g | Freq: Once | INTRAVENOUS | Status: DC
  Filled 2014-09-02: qty 500

## 2014-09-02 MED ORDER — WITCH HAZEL-GLYCERIN EX PADS: 1.0000 "application " | MEDICATED_PAD | CUTANEOUS | Status: DC | PRN

## 2014-09-02 MED ORDER — DIPHENHYDRAMINE HCL 50 MG/ML IJ SOLN: 12.5000 mg | INTRAMUSCULAR | Status: DC | PRN

## 2014-09-02 MED ORDER — EPHEDRINE 5 MG/ML INJ
10.0000 mg | INTRAVENOUS | Status: DC | PRN
  Filled 2014-09-02: qty 2

## 2014-09-02 MED ORDER — LIDOCAINE HCL (PF) 1 % IJ SOLN
INTRAMUSCULAR | Status: DC | PRN
  Administered 2014-09-02 (×2): 8 mL

## 2014-09-02 MED ORDER — PHENYLEPHRINE 40 MCG/ML (10ML) SYRINGE FOR IV PUSH (FOR BLOOD PRESSURE SUPPORT)
80.0000 ug | PREFILLED_SYRINGE | INTRAVENOUS | Status: DC | PRN
  Filled 2014-09-02: qty 2

## 2014-09-02 MED ORDER — MAGNESIUM SULFATE 40 G IN LACTATED RINGERS - SIMPLE
1.0000 g/h | INTRAVENOUS | Status: DC
Start: 2014-09-02 — End: 2014-09-03
  Administered 2014-09-02: 0.5 g/h via INTRAVENOUS
  Administered 2014-09-02: 4 g/h via INTRAVENOUS
  Administered 2014-09-02: 2 g/h via INTRAVENOUS
  Filled 2014-09-02: qty 500

## 2014-09-02 MED ORDER — OXYCODONE-ACETAMINOPHEN 5-325 MG PO TABS
1.0000 | ORAL_TABLET | ORAL | Status: DC | PRN
  Administered 2014-09-03: 1 via ORAL
  Filled 2014-09-02: qty 1

## 2014-09-02 MED ORDER — OXYCODONE-ACETAMINOPHEN 5-325 MG PO TABS: 2.0000 | ORAL_TABLET | ORAL | Status: DC | PRN

## 2014-09-02 MED ORDER — POTASSIUM CHLORIDE CRYS ER 20 MEQ PO TBCR
20.0000 meq | EXTENDED_RELEASE_TABLET | Freq: Two times a day (BID) | ORAL | Status: DC
  Administered 2014-09-02: 20 meq via ORAL
  Filled 2014-09-02 (×3): qty 1

## 2014-09-02 MED ORDER — TETANUS-DIPHTH-ACELL PERTUSSIS 5-2.5-18.5 LF-MCG/0.5 IM SUSP
0.5000 mL | Freq: Once | INTRAMUSCULAR | Status: DC
Start: 2014-09-03 — End: 2014-09-03
  Filled 2014-09-02: qty 0.5

## 2014-09-02 MED ORDER — LANOLIN HYDROUS EX OINT: TOPICAL_OINTMENT | CUTANEOUS | Status: DC | PRN

## 2014-09-02 MED ORDER — IBUPROFEN 600 MG PO TABS
600.0000 mg | ORAL_TABLET | Freq: Four times a day (QID) | ORAL | Status: DC
  Administered 2014-09-03 (×2): 600 mg via ORAL
  Filled 2014-09-02 (×2): qty 1

## 2014-09-02 MED ORDER — DIBUCAINE 1 % RE OINT: 1.0000 "application " | TOPICAL_OINTMENT | RECTAL | Status: DC | PRN

## 2014-09-02 MED ORDER — FENTANYL 2.5 MCG/ML BUPIVACAINE 1/10 % EPIDURAL INFUSION (WH - ANES)
INTRAMUSCULAR | Status: AC
  Administered 2014-09-02: 14 mL/h
  Filled 2014-09-02: qty 125

## 2014-09-02 MED ORDER — POTASSIUM CHLORIDE 2 MEQ/ML IV SOLN
INTRAVENOUS | Status: DC
  Administered 2014-09-02: 21:00:00 via INTRAVENOUS
  Filled 2014-09-02 (×2): qty 1000

## 2014-09-02 MED ORDER — PHENYLEPHRINE 40 MCG/ML (10ML) SYRINGE FOR IV PUSH (FOR BLOOD PRESSURE SUPPORT)
PREFILLED_SYRINGE | INTRAVENOUS | Status: AC
  Administered 2014-09-02: 80 ug
  Filled 2014-09-02: qty 10

## 2014-09-02 MED ORDER — ONDANSETRON HCL 4 MG PO TABS
4.0000 mg | ORAL_TABLET | ORAL | Status: DC | PRN
  Administered 2014-09-05: 4 mg via ORAL
  Filled 2014-09-02: qty 1

## 2014-09-02 MED ORDER — PRENATAL MULTIVITAMIN CH
1.0000 | ORAL_TABLET | Freq: Every day | ORAL | Status: DC
Start: 2014-09-03 — End: 2014-09-06
  Administered 2014-09-03 – 2014-09-06 (×3): 1 via ORAL
  Filled 2014-09-02 (×4): qty 1

## 2014-09-02 MED ORDER — FAMOTIDINE IN NACL 20-0.9 MG/50ML-% IV SOLN
20.0000 mg | Freq: Once | INTRAVENOUS | Status: AC
  Administered 2014-09-02: 20 mg via INTRAVENOUS
  Filled 2014-09-02: qty 50

## 2014-09-02 MED ORDER — BENZOCAINE-MENTHOL 20-0.5 % EX AERO: 1.0000 "application " | INHALATION_SPRAY | CUTANEOUS | Status: DC | PRN

## 2014-09-02 MED ORDER — FERROUS SULFATE 325 (65 FE) MG PO TABS
325.0000 mg | ORAL_TABLET | Freq: Two times a day (BID) | ORAL | Status: DC
  Administered 2014-09-03 – 2014-09-06 (×7): 325 mg via ORAL
  Filled 2014-09-02 (×7): qty 1

## 2014-09-02 MED ORDER — FENTANYL 2.5 MCG/ML BUPIVACAINE 1/10 % EPIDURAL INFUSION (WH - ANES)
INTRAMUSCULAR | Status: DC | PRN
  Administered 2014-09-02: 14 mL/h via EPIDURAL

## 2014-09-02 MED ORDER — LACTATED RINGERS IV SOLN
500.0000 mL | Freq: Once | INTRAVENOUS | Status: AC
  Administered 2014-09-02: 500 mL via INTRAVENOUS

## 2014-09-02 MED ORDER — GLYCOPYRROLATE 1 MG PO TABS
1.0000 mg | ORAL_TABLET | Freq: Two times a day (BID) | ORAL | Status: DC | PRN
  Filled 2014-09-02: qty 1

## 2014-09-02 MED ORDER — MAGNESIUM HYDROXIDE 400 MG/5ML PO SUSP
30.0000 mL | ORAL | Status: DC | PRN
  Filled 2014-09-02: qty 30

## 2014-09-02 MED ORDER — MEASLES, MUMPS & RUBELLA VAC ~~LOC~~ INJ
0.5000 mL | INJECTION | Freq: Once | SUBCUTANEOUS | Status: DC
  Filled 2014-09-02: qty 0.5

## 2014-09-02 MED ORDER — SENNOSIDES-DOCUSATE SODIUM 8.6-50 MG PO TABS
2.0000 | ORAL_TABLET | ORAL | Status: DC
  Administered 2014-09-03 – 2014-09-05 (×4): 2 via ORAL
  Filled 2014-09-02 (×4): qty 2

## 2014-09-02 MED ORDER — ONDANSETRON HCL 4 MG/2ML IJ SOLN: 4.0000 mg | INTRAMUSCULAR | Status: DC | PRN

## 2014-09-02 MED ORDER — FENTANYL 2.5 MCG/ML BUPIVACAINE 1/10 % EPIDURAL INFUSION (WH - ANES): 14.0000 mL/h | INTRAMUSCULAR | Status: DC | PRN

## 2014-09-02 MED ORDER — DIPHENHYDRAMINE HCL 25 MG PO CAPS: 25.0000 mg | ORAL_CAPSULE | Freq: Four times a day (QID) | ORAL | Status: DC | PRN

## 2014-09-02 NOTE — Plan of Care (Signed)
Problem: Phase II Progression Outcomes Goal: Labs/tests as ordered Labs/tests as ordered (Magnesium level, CBG's, CBC, CMET, 24 hr Urine, Amniocentesis, Ultrasound, Other)  Outcome: Progressing CBC, LDH, CMP ordered STAT MD at bed side

## 2014-09-02 NOTE — Progress Notes (Signed)
CRITICAL VALUE ALERT  Critical value received: 6.8 magnesium value   Date of notification:  09/02/2014  Time of notification:  1620  Critical value read back:Yes.    Nurse who received alert:  [place] Crane rn  MD notified (1st page)Jackson-Moore MD  Time of first page:  1621  MD notified (2nd page):  Time of second page: Christell Constant Responding MD:  Dr Tamela Oddi  Time MD responded:  [phone]

## 2014-09-02 NOTE — Plan of Care (Signed)
Problem: Phase II Progression Outcomes Goal: Medications as ordered (see description) Medications as ordered (Magnesium Sulfate, Steroids, PO Tocolysis, Antibiotics, Terbutaline Pump, Other)  Outcome: Progressing Kdur po , pepcid IV hung per MD order

## 2014-09-02 NOTE — Plan of Care (Signed)
Problem: Phase II Progression Outcomes Goal: Output > 30 ml/hr or voiding qs Outcome: Progressing Decrease urine output, Dr Jean Rosenthal -moore in room. UA orderd STAT

## 2014-09-02 NOTE — Anesthesia Procedure Notes (Signed)
Epidural Patient location during procedure: OB Start time: 09/02/2014 10:49 AM End time: 09/02/2014 10:53 AM  Staffing Anesthesiologist: Leilani Able Performed by: anesthesiologist   Preanesthetic Checklist Completed: patient identified, surgical consent, pre-op evaluation, timeout performed, IV checked, risks and benefits discussed and monitors and equipment checked  Epidural Patient position: sitting Prep: site prepped and draped and DuraPrep Patient monitoring: continuous pulse ox and blood pressure Approach: midline Location: L3-L4 Injection technique: LOR air  Needle:  Needle type: Tuohy  Needle gauge: 17 G Needle length: 9 cm and 9 Needle insertion depth: 5 cm cm Catheter type: closed end flexible Catheter size: 19 Gauge Catheter at skin depth: 10 cm Test dose: negative and Other  Assessment Sensory level: T9 Events: blood not aspirated, injection not painful, no injection resistance, negative IV test and no paresthesia  Additional Notes Reason for block:procedure for pain

## 2014-09-02 NOTE — Progress Notes (Signed)
Dr. Tamela Oddi notified of patient's contraction pattern, SVE, and pitocin rate of 14mu/min.  RN to keep pitocin at 1mu/min until MD assesses patient this AM.

## 2014-09-02 NOTE — Plan of Care (Signed)
Problem: Phase I Progression Outcomes Goal: Medications/IV Fluids N/A Outcome: Progressing Magnesium Sulfate started with a 4 gm bolus, lab values reviewed notified MD.

## 2014-09-02 NOTE — Anesthesia Preprocedure Evaluation (Signed)
Anesthesia Evaluation  Patient identified by MRN, date of birth, ID band Patient awake    Reviewed: Allergy & Precautions, H&P , NPO status , Patient's Chart, lab work & pertinent test results  Airway Mallampati: I TM Distance: >3 FB Neck ROM: full    Dental no notable dental hx.    Pulmonary neg pulmonary ROS,    Pulmonary exam normal       Cardiovascular hypertension, Pt. on medications     Neuro/Psych negative neurological ROS  negative psych ROS   GI/Hepatic negative GI ROS, Neg liver ROS,   Endo/Other  negative endocrine ROS  Renal/GU negative Renal ROS     Musculoskeletal   Abdominal Normal abdominal exam  (+)   Peds  Hematology negative hematology ROS (+)   Anesthesia Other Findings   Reproductive/Obstetrics (+) Pregnancy                           Anesthesia Physical Anesthesia Plan  ASA: III  Anesthesia Plan: Epidural   Post-op Pain Management:    Induction:   Airway Management Planned:   Additional Equipment:   Intra-op Plan:   Post-operative Plan:   Informed Consent: I have reviewed the patients History and Physical, chart, labs and discussed the procedure including the risks, benefits and alternatives for the proposed anesthesia with the patient or authorized representative who has indicated his/her understanding and acceptance.     Plan Discussed with: CRNA and Surgeon  Anesthesia Plan Comments:         Anesthesia Quick Evaluation

## 2014-09-02 NOTE — Plan of Care (Signed)
Problem: Phase I Progression Outcomes Goal: Assess per MD/Nurse,Routine-VS,FHR,UC,Head to Toe assess Outcome: Progressing BP's progressing normally

## 2014-09-02 NOTE — Progress Notes (Signed)
Amy [name] is a 28 y.o. G4P0030 at [redacted]w[redacted]d by LMP admitted for induction of labor due to preeclampsia with severe features.  Subjective: Uncomfortable w/UCs  Objective: BP 142/94  Pulse 95  Temp(Src) 98.3 F (36.8 C) (Oral)  Resp 18  Ht  (1.575 m)  Wt 62.869 kg (138 lb 9.6 oz)  BMI 25.34 kg/m2  SpO2 100%  LMP 01/11/2014 I/O last 3 completed shifts: In: 1497.4 [I.V.:1447.4; IV Piggyback:50] Out: 1255 [Urine:655; Emesis/NG output:600]    FHT:  FHR: 140 bpm, variability: moderate,  accelerations:  Present,  decelerations:  Absent UC:   regular, every 5 minutes SVE:   Dilation: 3 Effacement (%): 80 Station: -2 Exam by:: Dr Tamela Oddi AROM for clear fluid Labs: Lab Results  Component Value Date   WBC 7.1 09/01/2014   HGB 11.9* 09/01/2014   HCT 36.0 09/01/2014   MCV 78.1 09/01/2014   PLT 183 09/01/2014    Assessment / Plan: Early labor Acute renal insufficiency-- renal function improving; likely some component was prerenal Mild hypokalememia Labor: see above Preeclampsia:  See above; B/Ps, LFTs stable Fetal Wellbeing:  Category I Pain Control:  Labor support without medications I/D:  n/a Anticipated MOD:  NSVD Check U/A Increase IVF May start MgSO4 for seizure if renal function continues to improve Kdur JACKSON-MOORE,Tracye Szuch A 09/02/2014, 8:24 AM

## 2014-09-03 LAB — CBC
HCT: 32.1 % — ABNORMAL LOW (ref 36.0–46.0)
HEMOGLOBIN: 10.5 g/dL — AB (ref 12.0–15.0)
MCH: 25.5 pg — AB (ref 26.0–34.0)
MCHC: 32.7 g/dL (ref 30.0–36.0)
MCV: 77.9 fL — ABNORMAL LOW (ref 78.0–100.0)
Platelets: 166 10*3/uL (ref 150–400)
RBC: 4.12 MIL/uL (ref 3.87–5.11)
RDW: 14.3 % (ref 11.5–15.5)
WBC: 19.6 10*3/uL — AB (ref 4.0–10.5)

## 2014-09-03 LAB — COMPREHENSIVE METABOLIC PANEL
ALBUMIN: 2.2 g/dL — AB (ref 3.5–5.2)
ALK PHOS: 122 U/L — AB (ref 39–117)
ALT: 283 U/L — AB (ref 0–35)
AST: 186 U/L — AB (ref 0–37)
Anion gap: 14 (ref 5–15)
BILIRUBIN TOTAL: 0.2 mg/dL — AB (ref 0.3–1.2)
BUN: 17 mg/dL (ref 6–23)
CHLORIDE: 97 meq/L (ref 96–112)
CO2: 28 meq/L (ref 19–32)
CREATININE: 1.3 mg/dL — AB (ref 0.50–1.10)
Calcium: 8.1 mg/dL — ABNORMAL LOW (ref 8.4–10.5)
GFR calc Af Amer: 63 mL/min — ABNORMAL LOW (ref >90)
GFR calc non Af Amer: 54 mL/min — ABNORMAL LOW (ref >90)
Glucose, Bld: 151 mg/dL — ABNORMAL HIGH (ref 70–99)
POTASSIUM: 3.2 meq/L — AB (ref 3.7–5.3)
SODIUM: 139 meq/L (ref 137–147)
Total Protein: 5.2 g/dL — ABNORMAL LOW (ref 6.0–8.3)

## 2014-09-03 LAB — MAGNESIUM: MAGNESIUM: 5.6 mg/dL — AB (ref 1.5–2.5)

## 2014-09-03 LAB — LACTATE DEHYDROGENASE: LDH: 356 U/L — ABNORMAL HIGH (ref 94–250)

## 2014-09-03 MED ORDER — TRAMADOL HCL 50 MG PO TABS
50.0000 mg | ORAL_TABLET | Freq: Four times a day (QID) | ORAL | Status: DC | PRN
  Administered 2014-09-05 – 2014-09-06 (×4): 50 mg via ORAL
  Filled 2014-09-03 (×4): qty 1

## 2014-09-03 MED ORDER — POTASSIUM CHLORIDE CRYS ER 20 MEQ PO TBCR
20.0000 meq | EXTENDED_RELEASE_TABLET | Freq: Two times a day (BID) | ORAL | Status: DC
  Administered 2014-09-03 – 2014-09-06 (×7): 20 meq via ORAL
  Filled 2014-09-03 (×9): qty 1

## 2014-09-03 NOTE — Anesthesia Postprocedure Evaluation (Signed)
Anesthesia Post Note  Patient: Amy [name]  Procedure(s) Performed: * No procedures listed *  Anesthesia type: Epidural  Patient location:AICU  Post pain: Pain level controlled  Post assessment: Post-op Vital signs reviewed  Last Vitals:  Filed Vitals:   09/03/14 1000  BP: 124/79  Pulse: 105  Temp:   Resp: 16    Post vital signs: Reviewed  Level of consciousness:alert  Complications: No apparent anesthesia complications

## 2014-09-03 NOTE — Progress Notes (Signed)
Patient ID: Amy [name], female   DOB: 04/16/1986, 28 y.o.   MRN: [id number] Post Partum Day 1 S/P induced vaginal RH status/Rubella reviewed.  Feeding: breast pump Subjective: No HA, SOB, CP, F/C, breast symptoms. Normal vaginal bleeding, no clots.     Objective: BP 135/72  Pulse 95  Temp(Src) 98.1 F (36.7 C) (Oral)  Resp 16  Ht  (1.575 m)  Wt 69.627 kg (153 lb 8 oz)  BMI 28.07 kg/m2  SpO2 100%  LMP 01/11/2014  Breastfeeding? Unknown  DTRS: +1  Intake/Output Summary (Last 24 hours) at 09/03/14 7846 Last data filed at 09/03/14 0700  Gross per 24 hour  Intake 4784.65 ml  Output   1750 ml  Net 3034.65 ml    Physical Exam:  General: alert Lochia: appropriate Uterine Fundus: firm DVT Evaluation: No evidence of DVT seen on physical exam. Ext: No c/c/e  Results for orders placed during the hospital encounter of 08/31/14 (from the past 24 hour(s))  BLOOD GAS, CORD     Status: None   Collection Time    09/02/14  3:10 PM      Result Value Ref Range   pH cord blood REORDERED ON BABY MRN/DH     pCO2 cord blood TEST WILL BE CREDITED     pO2 cord blood TEST WILL BE CREDITED     Bicarbonate TEST WILL BE CREDITED  20.0 - 24.0 mEq/L   TCO2 TEST WILL BE CREDITED  0 - 100 mmol/L   Acid-Base Excess TEST WILL BE CREDITED  0.0 - 2.0 mmol/L   Acid-base deficit TEST WILL BE CREDITED  0.0 - 2.0 mmol/L  MAGNESIUM     Status: Abnormal   Collection Time    09/02/14  3:40 PM      Result Value Ref Range   Magnesium 6.8 (*) 1.5 - 2.5 mg/dL  CBC     Status: Abnormal   Collection Time    09/02/14  3:40 PM      Result Value Ref Range   WBC 15.3 (*) 4.0 - 10.5 K/uL   RBC 4.71  3.87 - 5.11 MIL/uL   Hemoglobin 12.2  12.0 - 15.0 g/dL   HCT 96.2  95.2 - 84.1 %   MCV 79.2  78.0 - 100.0 fL   MCH 25.9 (*) 26.0 - 34.0 pg   MCHC 32.7  30.0 - 36.0 g/dL   RDW 32.4  40.1 - 02.7 %   Platelets 194  150 - 400 K/uL  MRSA PCR SCREENING     Status: None   Collection Time    09/02/14  6:45 PM       Result Value Ref Range   MRSA by PCR NEGATIVE  NEGATIVE  MAGNESIUM     Status: Abnormal   Collection Time    09/02/14  9:05 PM      Result Value Ref Range   Magnesium 5.7 (*) 1.5 - 2.5 mg/dL  MAGNESIUM     Status: Abnormal   Collection Time    09/03/14  4:00 AM      Result Value Ref Range   Magnesium 5.6 (*) 1.5 - 2.5 mg/dL  CBC     Status: Abnormal   Collection Time    09/03/14  4:00 AM      Result Value Ref Range   WBC 19.6 (*) 4.0 - 10.5 K/uL   RBC 4.12  3.87 - 5.11 MIL/uL   Hemoglobin 10.5 (*) 12.0 - 15.0 g/dL   HCT 25.3 (*)  36.0 - 46.0 %   MCV 77.9 (*) 78.0 - 100.0 fL   MCH 25.5 (*) 26.0 - 34.0 pg   MCHC 32.7  30.0 - 36.0 g/dL   RDW 28.4  13.2 - 44.0 %   Platelets 166  150 - 400 K/uL  COMPREHENSIVE METABOLIC PANEL     Status: Abnormal   Collection Time    09/03/14  4:00 AM      Result Value Ref Range   Sodium 139  137 - 147 mEq/L   Potassium 3.2 (*) 3.7 - 5.3 mEq/L   Chloride 97  96 - 112 mEq/L   CO2 28  19 - 32 mEq/L   Glucose, Bld 151 (*) 70 - 99 mg/dL   BUN 17  6 - 23 mg/dL   Creatinine, Ser 1.02 (*) 0.50 - 1.10 mg/dL   Calcium 8.1 (*) 8.4 - 10.5 mg/dL   Total Protein 5.2 (*) 6.0 - 8.3 g/dL   Albumin 2.2 (*) 3.5 - 5.2 g/dL   AST 725 (*) 0 - 37 U/L   ALT 283 (*) 0 - 35 U/L   Alkaline Phosphatase 122 (*) 39 - 117 U/L   Total Bilirubin 0.2 (*) 0.3 - 1.2 mg/dL   GFR calc non Af Amer 54 (*) >90 mL/min   GFR calc Af Amer 63 (*) >90 mL/min   Anion gap 14  5 - 15  LACTATE DEHYDROGENASE     Status: Abnormal   Collection Time    09/03/14  4:00 AM      Result Value Ref Range   LDH 356 (*) 94 - 250 U/L       Assessment/Plan: 28 y.o.  PPD #1 .  normal postpartum exam Preeclampsia w/severe features: renal function continues to review; LFTS stable; B/Ps in range Mild hypokalemia  Continue current postpartum care Continue Mg until 24 hrs postpartum Continue Kdur Ambulate   LOS: 3 days   JACKSON-MOORE,Jozsef Wescoat A 09/03/2014, 8:32 AM

## 2014-09-04 LAB — COMPREHENSIVE METABOLIC PANEL
ALK PHOS: 132 U/L — AB (ref 39–117)
ALT: 273 U/L — ABNORMAL HIGH (ref 0–35)
ALT: 179 U/L — ABNORMAL HIGH (ref 0–35)
ANION GAP: 12 (ref 5–15)
AST: 189 U/L — ABNORMAL HIGH (ref 0–37)
AST: 75 U/L — ABNORMAL HIGH (ref 0–37)
Albumin: 2.9 g/dL — ABNORMAL LOW (ref 3.5–5.2)
Albumin: 2.1 g/dL — ABNORMAL LOW (ref 3.5–5.2)
Alkaline Phosphatase: 128 U/L — ABNORMAL HIGH (ref 39–117)
Anion gap: 17 — ABNORMAL HIGH (ref 5–15)
BUN: 29 mg/dL — ABNORMAL HIGH (ref 6–23)
BUN: 14 mg/dL (ref 6–23)
CO2: 27 meq/L (ref 19–32)
CO2: 27 mEq/L (ref 19–32)
CREATININE: 1.8 mg/dL — AB (ref 0.50–1.10)
Calcium: 9.2 mg/dL (ref 8.4–10.5)
Calcium: 8.2 mg/dL — ABNORMAL LOW (ref 8.4–10.5)
Chloride: 93 mEq/L — ABNORMAL LOW (ref 96–112)
Chloride: 101 mEq/L (ref 96–112)
Creatinine, Ser: 1.1 mg/dL (ref 0.50–1.10)
GFR calc non Af Amer: 67 mL/min — ABNORMAL LOW (ref >90)
GFR, EST AFRICAN AMERICAN: 43 mL/min — AB (ref >90)
GFR, EST AFRICAN AMERICAN: 77 mL/min — AB (ref >90)
GFR, EST NON AFRICAN AMERICAN: 37 mL/min — AB (ref >90)
GLUCOSE: 138 mg/dL — AB (ref 70–99)
Glucose, Bld: 133 mg/dL — ABNORMAL HIGH (ref 70–99)
Potassium: 3.1 mEq/L — ABNORMAL LOW (ref 3.7–5.3)
Potassium: 3.3 mEq/L — ABNORMAL LOW (ref 3.7–5.3)
Sodium: 137 mEq/L (ref 137–147)
Sodium: 140 mEq/L (ref 137–147)
TOTAL PROTEIN: 6.5 g/dL (ref 6.0–8.3)
Total Bilirubin: 0.6 mg/dL (ref 0.3–1.2)
Total Bilirubin: <0.2 mg/dL — ABNORMAL LOW (ref 0.3–1.2)
Total Protein: 5.1 g/dL — ABNORMAL LOW (ref 6.0–8.3)

## 2014-09-04 LAB — CBC
HEMATOCRIT: 31.3 % — AB (ref 36.0–46.0)
HEMOGLOBIN: 9.9 g/dL — AB (ref 12.0–15.0)
MCH: 25.4 pg — AB (ref 26.0–34.0)
MCHC: 31.6 g/dL (ref 30.0–36.0)
MCV: 80.5 fL (ref 78.0–100.0)
Platelets: 158 10*3/uL (ref 150–400)
RBC: 3.89 MIL/uL (ref 3.87–5.11)
RDW: 14.6 % (ref 11.5–15.5)
WBC: 11.6 10*3/uL — ABNORMAL HIGH (ref 4.0–10.5)

## 2014-09-04 LAB — LACTATE DEHYDROGENASE: LDH: 330 U/L — ABNORMAL HIGH (ref 94–250)

## 2014-09-04 MED ORDER — TETANUS-DIPHTH-ACELL PERTUSSIS 5-2.5-18.5 LF-MCG/0.5 IM SUSP
0.5000 mL | Freq: Once | INTRAMUSCULAR | Status: AC
  Administered 2014-09-04: 0.5 mL via INTRAMUSCULAR

## 2014-09-04 NOTE — Lactation Note (Signed)
This note was copied from the chart of Amy Margareth Mottola. Lactation Consultation Note Initial visit done.  Mom states she pumped 30 mls last night.  Reinforced to pump every 3 hours for 15 minutes.  Mom taught hand expression and instructed to follow pumping with this for a few minutes on each breast.  Discussed milk coming to volume 3-5 days following birth.  Mom is active with [facility] and pump referral faxed.  Encouraged to call with concerns/assist prn.  Patient Name: Amy [name] Today's Date: 09/04/2014 Reason for consult: Initial assessment;NICU baby   Maternal Data    Feeding    LATCH Score/Interventions                      Lactation Tools Discussed/Used WIC Program: Yes Pump Review: Setup, frequency, and cleaning;Milk Storage Initiated by:: RN Date initiated:: 09/02/14   Consult Status Consult Status: Follow-up Date: 09/05/14 Follow-up type: In-patient    Huston Foley 09/04/2014, 10:57 AM

## 2014-09-04 NOTE — Plan of Care (Signed)
Problem: Phase I Progression Outcomes Goal: Voiding adequately Outcome: Completed/Met Date Met:  09/04/14 Voided qs amt of amber urine.

## 2014-09-04 NOTE — Progress Notes (Signed)
Clinical Social Work Department PSYCHOSOCIAL ASSESSMENT - MATERNAL/CHILD 09/04/2014  Patient:  Amy [name], Amy [name]  Account Number:  [id number]  [place] Date:  08/31/2014  Ardine Eng Name:   Amy [name]    Clinical Social Worker:  Amy Piedra, LCSW   Date/Time:  09/04/2014 03:30 PM  Date Referred:        Other referral source:   No referral-NICU admission    I:  FAMILY / [place] legal guardian:  PARENT  Guardian - Name Guardian - Age Guardian - Address  Amy [name] [address] [place], [place] [address]  Donnelly Stager  same/also spends a lot of time with his mother (who has health issues) in [place]   Other household support members/support persons Other support:   MOB states she has a great support system.  She reports that she and FOB are in a committed relationship and that they live together, when FOB is not in [place] helping his mother.  MOB states she is from [place], and has lived here approximately 2 years.  Her mother and two sisters have also moved to [place].    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 resources:  [employer] If Medicaid - County:  [employer] / Grade:   Maternity Care Coordinator / Child Services Coordination / Early Interventions:  Cultural issues impacting care:   None stated    III  STRENGTHS Strengths  Adequate Resources  Compliance with medical plan  Home prepared for Child (including basic supplies)  Other - See comment  Supportive family/friends  Understanding of illness   Strength comment:  MOB states she is not familiar with the pediatricians in the area and would like a "referral from the hospital." CSW explained that we do not necessarily give referrals, but that we can provide her with a pediatrician list.  She was appreciative.  CSW informed her of the offices who are always  taking new medicaid patients and advised that she make calls to the other offices if she is interested in those.   IV  RISK FACTORS AND CURRENT PROBLEMS Current Problem:  None   Risk Factor & Current Problem Patient Issue Family Issue Risk Factor / Current Problem Comment   N N     V  SOCIAL WORK ASSESSMENT  CSW met with MOB in her third floor room/318 to introduce myself, complete assessment due to NICU admission of infant at 33.3 weeks and to offer support.  MOB was pleasant and welcoming.  Her sister was visiting with her and MOB stated we could talk with her present.  Sister was not involved heavily in the conversation.  CSW introduced ongoing support services offered by NICU CSW and gave contact information.  MOB shared her story of pregnancy and reason for early delivery.  CSW thanked her for sharing.  She states she was not prepared to be told at 33.3 weeks that she would be having a baby, but she "said a prayer and left it in God's hands."  She states she is thankful that she and her baby are both here and healthy.  She spoke openly about her emotions and feelings regarding leaving her baby at discharge.  CSW validated her feelings of sadness.  MOB was tearful while we discussed this.  CSW encouraged her to allow herself to be emotional and reminded her that it is necessary for baby to be in  the hospital.  CSW encouraged her to embrace this as her unique birth story and to enjoy her time with baby while she is in the hospital as important bonding time.  We discussed patience and how to set herself up for the best possible emotional experience.  CSW provided education on PPD signs and symptoms and asked that MOB talk with CSW and or her medical provider if she has concerns about her emotions at any time.  MOB agreed.  MOB states she has a good support system and everything she needs for baby at home.  She states she has asked a lot of questions to the NICU staff and has a good understanding of  baby's current condition and what she will need to accomplish before being able to go home.  She states no issues with transportation.  She is clearly happy and excited about becoming a mother as she beams when talking about her daughter.  She was happy to show pictures to CSW.  MOB states no questions, concerns or needs at this time, and seemed appreciative of CSW's concern for her emotional wellbeing.  CSW is not aware of any social concerns at this time.   VI SOCIAL WORK PLAN Social Work Plan  Psychosocial Support/Ongoing Assessment of Needs  Patient/Family Education   Type of pt/family education:   Ongoing support services offered by NICU CSW  PPD signs and symptoms/common emotions related to the NICU experience   If child protective services report - county:   If child protective services report - date:   Information/referral to community resources comment:   No referral needs noted at this time.   Other social work plan:

## 2014-09-04 NOTE — Progress Notes (Signed)
Patient ID: Amy [name], female   DOB: [birth year], 28 y.o.   MRN: [id number] Postpartum day 2 Vital signs normal fundus firm Lochia moderate Legs negative Creatinine 1.6 hemoglobin 9.3 white count down to 14,000 awaiting results of the  cmet Patient feels fine a little discharged in

## 2014-09-04 NOTE — Progress Notes (Signed)
Ur chart review completed.  

## 2014-09-04 NOTE — Progress Notes (Signed)
CSW attempted to meet with MOB, but she was not in her room at this time.  CSW checked at baby's bedside, but MOB had two visitors with her.  CSW will attempt again at a later time.  CSW identifies no social stressors upon chart review.

## 2014-09-05 LAB — LACTATE DEHYDROGENASE: LDH: 312 U/L — AB (ref 94–250)

## 2014-09-05 LAB — CBC
HEMATOCRIT: 35.4 % — AB (ref 36.0–46.0)
Hemoglobin: 11.3 g/dL — ABNORMAL LOW (ref 12.0–15.0)
MCH: 25.9 pg — AB (ref 26.0–34.0)
MCHC: 31.9 g/dL (ref 30.0–36.0)
MCV: 81 fL (ref 78.0–100.0)
Platelets: 164 10*3/uL (ref 150–400)
RBC: 4.37 MIL/uL (ref 3.87–5.11)
RDW: 14.7 % (ref 11.5–15.5)
WBC: 11.8 10*3/uL — ABNORMAL HIGH (ref 4.0–10.5)

## 2014-09-05 LAB — COMPREHENSIVE METABOLIC PANEL
ALBUMIN: 2.1 g/dL — AB (ref 3.5–5.2)
ALBUMIN: 2.2 g/dL — AB (ref 3.5–5.2)
ALK PHOS: 103 U/L (ref 39–117)
ALK PHOS: 95 U/L (ref 39–117)
ALT: 149 U/L — AB (ref 0–35)
ALT: 168 U/L — ABNORMAL HIGH (ref 0–35)
AST: 70 U/L — ABNORMAL HIGH (ref 0–37)
AST: 92 U/L — ABNORMAL HIGH (ref 0–37)
Anion gap: 13 (ref 5–15)
Anion gap: 11 (ref 5–15)
BUN: 10 mg/dL (ref 6–23)
BUN: 8 mg/dL (ref 6–23)
CALCIUM: 8.5 mg/dL (ref 8.4–10.5)
CO2: 25 mEq/L (ref 19–32)
CO2: 28 mEq/L (ref 19–32)
CREATININE: 0.79 mg/dL (ref 0.50–1.10)
Calcium: 8.1 mg/dL — ABNORMAL LOW (ref 8.4–10.5)
Chloride: 103 mEq/L (ref 96–112)
Chloride: 102 mEq/L (ref 96–112)
Creatinine, Ser: 0.85 mg/dL (ref 0.50–1.10)
GFR calc Af Amer: >90 mL/min (ref >90)
GFR calc non Af Amer: >90 mL/min (ref >90)
GFR calc non Af Amer: >90 mL/min (ref >90)
GLUCOSE: 71 mg/dL (ref 70–99)
Glucose, Bld: 104 mg/dL — ABNORMAL HIGH (ref 70–99)
POTASSIUM: 3.1 meq/L — AB (ref 3.7–5.3)
POTASSIUM: 3.5 meq/L — AB (ref 3.7–5.3)
SODIUM: 141 meq/L (ref 137–147)
Sodium: 141 mEq/L (ref 137–147)
TOTAL PROTEIN: 5.2 g/dL — AB (ref 6.0–8.3)
Total Bilirubin: <0.2 mg/dL — ABNORMAL LOW (ref 0.3–1.2)
Total Protein: 5.1 g/dL — ABNORMAL LOW (ref 6.0–8.3)

## 2014-09-05 LAB — URIC ACID: Uric Acid, Serum: 7.4 mg/dL — ABNORMAL HIGH (ref 2.4–7.0)

## 2014-09-05 MED ORDER — LABETALOL HCL 200 MG PO TABS
200.0000 mg | ORAL_TABLET | Freq: Three times a day (TID) | ORAL | Status: DC
  Administered 2014-09-05 – 2014-09-06 (×4): 200 mg via ORAL
  Filled 2014-09-05 (×7): qty 1

## 2014-09-05 NOTE — Lactation Note (Signed)
This note was copied from the chart of Amy [name]. Lactation Consultation Note  Mom states she has not pumped today because her blood pressure is high and she doesn't feel well.  Encouraged to begin pumping again when she feels better.  Follow up in AM.  Patient Name: Amy [name] Today'[name] Date: 09/05/2014     Maternal Data    Feeding    LATCH Score/Interventions                      Lactation Tools Discussed/Used     Consult Status      Amy [name], Amy [name] 09/05/2014, 1:02 PM

## 2014-09-05 NOTE — Progress Notes (Signed)
Patient ID: Amy [name], female   DOB: 04/10/1986, 28 y.o.   MRN: [id number]

## 2014-09-05 NOTE — Progress Notes (Signed)
Pt BP 164/108. MD notified, labetalol 200 mg TID ordered. Pt stated she had dull headache earlier, but states she does not have any pain right now.

## 2014-09-05 NOTE — Progress Notes (Signed)
Patient ID: Margaretha SeedsKynnethia Teehan, female   DOB: [birth year], 28 y.o.   MRN: [id number] Postpartum day 3 this morning blood pressure was 154/9 6 and since then she's had some elevated readings patient feels fine she was started on labetalol 200 mg by mouth 3 times a day her labs much better and fundus firm lochia moderate legs negative so patient will not be discharged  Until  blood pressure is  under control

## 2014-09-06 NOTE — Progress Notes (Signed)
Patient ambulated for discharge instable  Condition.

## 2014-09-06 NOTE — Progress Notes (Signed)
Discharge instructions reviewed with patient.  Patient states understanding of home care, medications, activity, signs/symptoms to report to MD and return MD office visit.  Patients family will assist with her care @ home and no home equipment needed.

## 2014-09-06 NOTE — Lactation Note (Signed)
This note was copied from the chart of Amy Skyler Caroll. Lactation Consultation Note  Follow up visit made prior to maternal discharge.  Mom has not pumped consistently due to not feeling well since delivery.  She is concerned about not obtaining milk.  Reviewed supply and demand and instructed to pump every 3 hours.  Discussed that her milk may be delayed due to lack of stimulation and elevated B/P's.  [facility] able to [facility] pump today.  Encouraged to call with concerns prn.  Patient Name: Amy [name] Today's Date: 09/06/2014     Maternal Data    Feeding    LATCH Score/Interventions                      Lactation Tools Discussed/Used     Consult Status      Huston FoleyMOULDEN, Christinna Sprung S 09/06/2014, 10:01 AM

## 2014-09-06 NOTE — Progress Notes (Signed)
Patient ID: Amy [name], female   DOB: 23-Sep-1986, 28 y.o.   MRN: [id number] Postpartum therefore Blood pressure 150/90 she is on labetalol 204 times a day increase at home Laps are becoming normal Hemoglobins 9+ fundus firm lochia moderate Legs negative Normoreflexic she has no complaints she home today on labetalol 200 mg q. ID to see me Monday at 10 AM for blood pressure check

## 2014-09-06 NOTE — Discharge Summary (Signed)
Obstetric Discharge Summary Reason for Admission: induction of labor Prenatal Procedures: Preeclampsia Intrapartum Procedures: spontaneous vaginal delivery Postpartum Procedures: none Complications-Operative and Postpartum: none Hemoglobin  Date Value Ref Range Status  09/05/2014 11.3* 12.0 - 15.0 g/dL Final     HCT  Date Value Ref Range Status  09/05/2014 35.4* 36.0 - 46.0 % Final    Physical Exam:  General: alert Lochia: appropriate Uterine Fundus: firm Incision: healing well DVT Evaluation: No evidence of DVT seen on physical exam.  Discharge Diagnoses: Preelampsia  Discharge Information: Date: 09/06/2014 Activity: pelvic rest Diet: routine Medications: Percocet Condition: stable Instructions: refer to practice specific booklet Discharge to: home Follow-up Information   Follow up with MARSHALL,BERNARD A, MD In 5 days.   Specialty:  Obstetrics and Gynecology   Contact information:   [address] [place] [address] [phone]       Newborn Data: Live born female  Birth Weight: 3 lb 9.5 oz (1630 g) APGAR: 8, 9  Home with mother.  MARSHALL,BERNARD A 09/06/2014, 6:54 AM

## 2014-09-06 NOTE — Discharge Instructions (Signed)
Discharge instructions   You can wash your hair  Shower  Eat what you want  Drink what you want  See me in 6 weeks  Your ankles are going to swell more in the next 2 weeks than when pregnant  No sex for 6 weeks   Velina Drollinger A, MD 09/06/2014

## 2014-09-07 ENCOUNTER — Inpatient Hospital Stay (HOSPITAL_COMMUNITY)
Admission: AD | Admit: 2014-09-07 | Discharge: 2014-09-07 | Disposition: A | Discharge status: Home / Self Care | Payer: Medicaid Other | Source: Ambulatory Visit | Attending: Obstetrics | Admitting: Obstetrics

## 2014-09-07 ENCOUNTER — Encounter (HOSPITAL_COMMUNITY): Payer: Self-pay | Admitting: *Deleted

## 2014-09-07 DIAGNOSIS — I10 Essential (primary) hypertension: Secondary | ICD-10-CM | POA: Diagnosis not present

## 2014-09-07 DIAGNOSIS — G43A0 Cyclical vomiting, not intractable: Secondary | ICD-10-CM

## 2014-09-07 DIAGNOSIS — O1002 Pre-existing essential hypertension complicating childbirth: Secondary | ICD-10-CM

## 2014-09-07 DIAGNOSIS — K8080 Other cholelithiasis without obstruction: Secondary | ICD-10-CM | POA: Insufficient documentation

## 2014-09-07 DIAGNOSIS — R112 Nausea with vomiting, unspecified: Secondary | ICD-10-CM | POA: Insufficient documentation

## 2014-09-07 DIAGNOSIS — R1115 Cyclical vomiting syndrome unrelated to migraine: Secondary | ICD-10-CM

## 2014-09-07 DIAGNOSIS — R030 Elevated blood-pressure reading, without diagnosis of hypertension: Secondary | ICD-10-CM | POA: Diagnosis present

## 2014-09-07 DIAGNOSIS — K8020 Calculus of gallbladder without cholecystitis without obstruction: Secondary | ICD-10-CM

## 2014-09-07 LAB — CBC
HEMATOCRIT: 37 % (ref 36.0–46.0)
Hemoglobin: 11.7 g/dL — ABNORMAL LOW (ref 12.0–15.0)
MCH: 25.8 pg — ABNORMAL LOW (ref 26.0–34.0)
MCHC: 31.6 g/dL (ref 30.0–36.0)
MCV: 81.5 fL (ref 78.0–100.0)
PLATELETS: 207 10*3/uL (ref 150–400)
RBC: 4.54 MIL/uL (ref 3.87–5.11)
RDW: 15.5 % (ref 11.5–15.5)
WBC: 11 10*3/uL — AB (ref 4.0–10.5)

## 2014-09-07 LAB — URINALYSIS, ROUTINE W REFLEX MICROSCOPIC
Bilirubin Urine: NEGATIVE
Glucose, UA: NEGATIVE mg/dL
KETONES UR: NEGATIVE mg/dL
Nitrite: NEGATIVE
PROTEIN: NEGATIVE mg/dL
Specific Gravity, Urine: 1.01 (ref 1.005–1.030)
UROBILINOGEN UA: 0.2 mg/dL (ref 0.0–1.0)
pH: 7 (ref 5.0–8.0)

## 2014-09-07 LAB — URINE MICROSCOPIC-ADD ON

## 2014-09-07 MED ORDER — ONDANSETRON 8 MG PO TBDP: 8.0000 mg | ORAL_TABLET | Freq: Three times a day (TID) | ORAL | Status: DC | PRN

## 2014-09-07 MED ORDER — PROMETHAZINE HCL 25 MG PO TABS: 25.0000 mg | ORAL_TABLET | Freq: Four times a day (QID) | ORAL | Status: DC | PRN

## 2014-09-07 MED ORDER — PROMETHAZINE HCL 25 MG/ML IJ SOLN
25.0000 mg | Freq: Once | INTRAMUSCULAR | Status: AC
  Administered 2014-09-07: 25 mg via INTRAMUSCULAR
  Filled 2014-09-07: qty 1

## 2014-09-07 MED ORDER — ONDANSETRON 8 MG PO TBDP
8.0000 mg | ORAL_TABLET | Freq: Once | ORAL | Status: AC
  Administered 2014-09-07: 8 mg via ORAL
  Filled 2014-09-07: qty 1

## 2014-09-07 NOTE — Discharge Instructions (Signed)
Biliary Colic  °Biliary colic is a steady or irregular pain in the upper abdomen. It is usually under the right side of the rib cage. It happens when gallstones interfere with the normal flow of bile from the gallbladder. Bile is a liquid that helps to digest fats. Bile is made in the liver and stored in the gallbladder. When you eat a meal, bile passes from the gallbladder through the cystic duct and the common bile duct into the small intestine. There, it mixes with partially digested food. If a gallstone blocks either of these ducts, the normal flow of bile is blocked. The muscle cells in the bile duct contract forcefully to try to move the stone. This causes the pain of biliary colic.  °SYMPTOMS  °· A person with biliary colic usually complains of pain in the upper abdomen. This pain can be: °¨ In the center of the upper abdomen just below the breastbone. °¨ In the upper-right part of the abdomen, near the gallbladder and liver. °¨ Spread back toward the right shoulder blade. °· Nausea and vomiting. °· The pain usually occurs after eating. °· Biliary colic is usually triggered by the digestive system's demand for bile. The demand for bile is high after fatty meals. Symptoms can also occur when a person who has been fasting suddenly eats a very large meal. Most episodes of biliary colic pass after 1 to 5 hours. After the most intense pain passes, your abdomen may continue to ache mildly for about 24 hours. °DIAGNOSIS  °After you describe your symptoms, your caregiver will perform a physical exam. He or she will pay attention to the upper right portion of your belly (abdomen). This is the area of your liver and gallbladder. An ultrasound will help your caregiver look for gallstones. Specialized scans of the gallbladder may also be done. Blood tests may be done, especially if you have fever or if your pain persists. °PREVENTION  °Biliary colic can be prevented by controlling the risk factors for gallstones. Some of  these risk factors, such as heredity, increasing age, and pregnancy are a normal part of life. Obesity and a high-fat diet are risk factors you can change through a healthy lifestyle. Women going through menopause who take hormone replacement therapy (estrogen) are also more likely to develop biliary colic. °TREATMENT  °· Pain medication may be prescribed. °· You may be encouraged to eat a fat-free diet. °· If the first episode of biliary colic is severe, or episodes of colic keep retuning, surgery to remove the gallbladder (cholecystectomy) is usually recommended. This procedure can be done through small incisions using an instrument called a laparoscope. The procedure often requires a brief stay in the hospital. Some people can leave the hospital the same day. It is the most widely used treatment in people troubled by painful gallstones. It is effective and safe, with no complications in more than 90% of cases. °· If surgery cannot be done, medication that dissolves gallstones may be used. This medication is expensive and can take months or years to work. Only small stones will dissolve. °· Rarely, medication to dissolve gallstones is combined with a procedure called shock-wave lithotripsy. This procedure uses carefully aimed shock waves to break up gallstones. In many people treated with this procedure, gallstones form again within a few years. °PROGNOSIS  °If gallstones block your cystic duct or common bile duct, you are at risk for repeated episodes of biliary colic. There is also a 25% chance that you will develop   a gallbladder infection(acute cholecystitis), or some other complication of gallstones within 10 to 20 years. If you have surgery, schedule it at a time that is convenient for you and at a time when you are not sick. HOME CARE INSTRUCTIONS   Drink plenty of clear fluids.  Avoid fatty, greasy or fried foods, or any foods that make your pain worse.  Take medications as directed. SEEK MEDICAL  CARE IF:   You develop a fever over 100.5 F (38.1 C).  Your pain gets worse over time.  You develop nausea that prevents you from eating and drinking.  You develop vomiting. SEEK IMMEDIATE MEDICAL CARE IF:   You have continuous or severe belly (abdominal) pain which is not relieved with medications.  You develop nausea and vomiting which is not relieved with medications.  You have symptoms of biliary colic and you suddenly develop a fever and shaking chills. This may signal cholecystitis. Call your caregiver immediately.  You develop a yellow color to your skin or the white part of your eyes (jaundice). Document Released: 04/27/2006 Document Revised: 02/16/2012 Document Reviewed: 07/06/2008 [facility] Patient Information 2015 [place], [place]. This information is not intended to replace advice given to you by your health care provider. Make sure you discuss any questions you have with your health care provider.  Cholecystitis  Cholecystitis is swelling and irritation (inflammation) of your gallbladder. This often happens when gallstones or sludge build up in the gallbladder. Treatment is needed right away. HOME CARE Home care depends on how you were treated. In general:  If you were given antibiotic medicine, take it as told. Finish the medicine even if you start to feel better.  Only take medicines as told by your doctor.  Eat low-fat foods until your next doctor visit.  Keep all doctor visits as told. GET HELP RIGHT AWAY IF:  You have more pain and medicine does not help.  Your pain moves to a different part of your belly (abdomen) or to your back.  You have a fever.  You feel sick to your stomach (nauseous).  You throw up (vomit). MAKE SURE YOU:  Understand these instructions.  Will watch your condition.  Will get help right away if you are not doing well or get worse. Document Released: 11/13/2011 Document Revised: 02/16/2012 Document Reviewed:  11/13/2011 [facility] Patient Information 2015 [place], [place]. This information is not intended to replace advice given to you by your health care provider. Make sure you discuss any questions you have with your health care provider. Nausea and Vomiting Nausea is a sick feeling that often comes before throwing up (vomiting). Vomiting is a reflex where stomach contents come out of your mouth. Vomiting can cause severe loss of body fluids (dehydration). Children and elderly adults can become dehydrated quickly, especially if they also have diarrhea. Nausea and vomiting are symptoms of a condition or disease. It is important to find the cause of your symptoms. CAUSES   Direct irritation of the stomach lining. This irritation can result from increased acid production (gastroesophageal reflux disease), infection, food poisoning, taking certain medicines (such as nonsteroidal anti-inflammatory drugs), alcohol use, or tobacco use.  Signals from the brain.These signals could be caused by a headache, heat exposure, an inner ear disturbance, increased pressure in the brain from injury, infection, a tumor, or a concussion, pain, emotional stimulus, or metabolic problems.  An obstruction in the gastrointestinal tract (bowel obstruction).  Illnesses such as diabetes, hepatitis, gallbladder problems, appendicitis, kidney problems, cancer, sepsis, atypical symptoms of  a heart attack, or eating disorders.  Medical treatments such as chemotherapy and radiation.  Receiving medicine that makes you sleep (general anesthetic) during surgery. DIAGNOSIS Your caregiver may ask for tests to be done if the problems do not improve after a few days. Tests may also be done if symptoms are severe or if the reason for the nausea and vomiting is not clear. Tests may include:  Urine tests.  Blood tests.  Stool tests.  Cultures (to look for evidence of infection).  X-rays or other imaging studies. Test results can help  your caregiver make decisions about treatment or the need for additional tests. TREATMENT You need to stay well hydrated. Drink frequently but in small amounts.You may wish to drink water, sports drinks, clear broth, or eat frozen ice pops or gelatin dessert to help stay hydrated.When you eat, eating slowly may help prevent nausea.There are also some antinausea medicines that may help prevent nausea. HOME CARE INSTRUCTIONS   Take all medicine as directed by your caregiver.  If you do not have an appetite, do not force yourself to eat. However, you must continue to drink fluids.  If you have an appetite, eat a normal diet unless your caregiver tells you differently.  Eat a variety of complex carbohydrates (rice, wheat, potatoes, bread), lean meats, yogurt, fruits, and vegetables.  Avoid high-fat foods because they are more difficult to digest.  Drink enough water and fluids to keep your urine clear or pale yellow.  If you are dehydrated, ask your caregiver for specific rehydration instructions. Signs of dehydration may include:  Severe thirst.  Dry lips and mouth.  Dizziness.  Dark urine.  Decreasing urine frequency and amount.  Confusion.  Rapid breathing or pulse. SEEK IMMEDIATE MEDICAL CARE IF:   You have blood or brown flecks (like coffee grounds) in your vomit.  You have black or bloody stools.  You have a severe headache or stiff neck.  You are confused.  You have severe abdominal pain.  You have chest pain or trouble breathing.  You do not urinate at least once every 8 hours.  You develop cold or clammy skin.  You continue to vomit for longer than 24 to 48 hours.  You have a fever. MAKE SURE YOU:   Understand these instructions.  Will watch your condition.  Will get help right away if you are not doing well or get worse. Document Released: 11/24/2005 Document Revised: 02/16/2012 Document Reviewed: 04/23/2011 [facility] Patient Information 2015  [place]. This information is not intended to replace advice given to you by your health care provider. Make sure you discuss any questions you have with your health care provider. Low-Fat Diet for Pancreatitis or Gallbladder Conditions A low-fat diet can be helpful if you have pancreatitis or a gallbladder condition. With these conditions, your pancreas and gallbladder have trouble digesting fats. A healthy eating plan with less fat will help rest your pancreas and gallbladder and reduce your symptoms. WHAT DO I NEED TO KNOW ABOUT THIS DIET?  Eat a low-fat diet.  Reduce your fat intake to less than 20-30% of your total daily calories. This is less than 50-60 g of fat per day.  Remember that you need some fat in your diet. Ask your dietician what your daily goal should be.  Choose nonfat and low-fat healthy foods. Look for the words "nonfat," "low fat," or "fat free."  As a guide, look on the label and choose foods with less than 3 g of fat per  serving. Eat only one serving.  Avoid alcohol.  Do not smoke. If you need help quitting, talk with your health care provider.  Eat small frequent meals instead of three large heavy meals. WHAT FOODS CAN I EAT? Grains Include healthy grains and starches such as potatoes, wheat bread, fiber-rich cereal, and brown rice. Choose whole grain options whenever possible. In adults, whole grains should account for 45-65% of your daily calories.  Fruits and Vegetables Eat plenty of fruits and vegetables. Fresh fruits and vegetables add fiber to your diet. Meats and Other Protein Sources Eat lean meat such as chicken and pork. Trim any fat off of meat before cooking it. Eggs, fish, and beans are other sources of protein. In adults, these foods should account for 10-35% of your daily calories. Dairy Choose low-fat milk and dairy options. Dairy includes fat and protein, as well as calcium.  Fats and Oils Limit high-fat foods such as fried foods, sweets,  baked goods, sugary drinks.  Other Creamy sauces and condiments, such as mayonnaise, can add extra fat. Think about whether or not you need to use them, or use smaller amounts or low fat options. WHAT FOODS ARE NOT RECOMMENDED?  High fat foods, such as:  [employer].  Ice cream.  [place] toast.  Sweet rolls.  Pizza.  Cheese bread.  Foods covered with batter, butter, creamy sauces, or cheese.  Fried foods.  Sugary drinks and desserts.  Foods that cause gas or bloating Document Released: 11/29/2013 Document Reviewed: 11/29/2013 [facility] Patient Information 2015 [place], [place]. This information is not intended to replace advice given to you by your health care provider. Make sure you discuss any questions you have with your health care provider.

## 2014-09-07 NOTE — MAU Note (Addendum)
Del vag on 09/26, induced for HTN.  Dc'e 09/30. Started vomiting this morning. Real bad stomach pains, cramps.  No diarrhea or fever.   Denies HA or visual changes

## 2014-09-07 NOTE — MAU Provider Note (Signed)
History     CSN: [id number]  Arrival date and time: 09/07/14 0908   First Provider Initiated Contact with Patient 09/07/14 1157      No chief complaint on file.  HPI Comments: Amy [name] 28 y.o. [phone] presents to MAU with nausea, vomiting and elevated blood pressure. She was delivered early due to HELLP Syndrome on 9/26 by Dr Gaynell Face. She was discharged to home yesterday. This morning about 5 am she became nauseated and started to vomit. She did not take her BP medications or any nausea or pain medications. She denies any headache or any other problems. Mother adds she ate hamburger for dinner last night     Past Medical History  Diagnosis Date  . HELLP syndrome     G4    Past Surgical History  Procedure Laterality Date  . Dilation and curettage of uterus    . Induced abortion      Family History  Problem Relation Age of Onset  . Heart disease Father     History  Substance Use Topics  . Smoking status: Never Smoker   . Smokeless tobacco: Never Used  . Alcohol Use: No    Allergies: No Known Allergies  Prescriptions prior to admission  Medication Sig Dispense Refill  . labetalol (NORMODYNE) 200 MG tablet Take 200 mg by mouth 3 (three) times daily.      Marland Kitchen oxyCODONE-acetaminophen (PERCOCET/ROXICET) 5-325 MG per tablet Take 1 tablet by mouth every 4 (four) hours as needed for moderate pain or severe pain.        Review of Systems  Constitutional: Negative.   HENT: Negative.   Eyes: Negative.   Respiratory: Negative.   Cardiovascular: Negative.   Gastrointestinal: Positive for nausea and vomiting.  Genitourinary: Negative.   Musculoskeletal: Negative.   Skin: Negative.   Neurological: Negative.   Psychiatric/Behavioral: Negative.    Physical Exam   Blood pressure 152/91, pulse 68, temperature 98.9 F (37.2 C), temperature source Oral, resp. rate 16, height 5\' 2"  (1.575 m), weight 69.911 kg (154 lb 2 oz), last menstrual period 01/11/2014, unknown if  currently breastfeeding.  Physical Exam  Constitutional: She is oriented to person, place, and time. She appears well-developed and well-nourished. No distress.  HENT:  Head: Normocephalic and atraumatic.  Cardiovascular: Normal rate, regular rhythm and normal heart sounds.   Respiratory: Effort normal and breath sounds normal.  GI: Soft.  Musculoskeletal: Normal range of motion.  Neurological: She is oriented to person, place, and time.  Skin: Skin is warm and dry.  Psychiatric: She has a normal mood and affect. Her behavior is normal. Thought content normal.   Results for orders placed during the hospital encounter of 09/07/14 (from the past 24 hour(s))  URINALYSIS, ROUTINE W REFLEX MICROSCOPIC     Status: Abnormal   Collection Time    09/07/14 10:14 AM      Result Value Ref Range   Color, Urine YELLOW  YELLOW   APPearance CLEAR  CLEAR   Specific Gravity, Urine 1.010  1.005 - 1.030   pH 7.0  5.0 - 8.0   Glucose, UA NEGATIVE  NEGATIVE mg/dL   Hgb urine dipstick MODERATE (*) NEGATIVE   Bilirubin Urine NEGATIVE  NEGATIVE   Ketones, ur NEGATIVE  NEGATIVE mg/dL   Protein, ur NEGATIVE  NEGATIVE mg/dL   Urobilinogen, UA 0.2  0.0 - 1.0 mg/dL   Nitrite NEGATIVE  NEGATIVE   Leukocytes, UA SMALL (*) NEGATIVE  URINE MICROSCOPIC-ADD ON     Status:  None   Collection Time    09/07/14 10:14 AM      Result Value Ref Range   Squamous Epithelial / LPF RARE  RARE   WBC, UA 0-2  <3 WBC/hpf   RBC / HPF 3-6  <3 RBC/hpf   Bacteria, UA RARE  RARE   Results for orders placed during the hospital encounter of 09/07/14 (from the past 24 hour(s))  URINALYSIS, ROUTINE W REFLEX MICROSCOPIC     Status: Abnormal   Collection Time    09/07/14 10:14 AM      Result Value Ref Range   Color, Urine YELLOW  YELLOW   APPearance CLEAR  CLEAR   Specific Gravity, Urine 1.010  1.005 - 1.030   pH 7.0  5.0 - 8.0   Glucose, UA NEGATIVE  NEGATIVE mg/dL   Hgb urine dipstick MODERATE (*) NEGATIVE   Bilirubin Urine  NEGATIVE  NEGATIVE   Ketones, ur NEGATIVE  NEGATIVE mg/dL   Protein, ur NEGATIVE  NEGATIVE mg/dL   Urobilinogen, UA 0.2  0.0 - 1.0 mg/dL   Nitrite NEGATIVE  NEGATIVE   Leukocytes, UA SMALL (*) NEGATIVE  URINE MICROSCOPIC-ADD ON     Status: None   Collection Time    09/07/14 10:14 AM      Result Value Ref Range   Squamous Epithelial / LPF RARE  RARE   WBC, UA 0-2  <3 WBC/hpf   RBC / HPF 3-6  <3 RBC/hpf   Bacteria, UA RARE  RARE  CBC     Status: Abnormal   Collection Time    09/07/14 12:23 PM      Result Value Ref Range   WBC 11.0 (*) 4.0 - 10.5 K/uL   RBC 4.54  3.87 - 5.11 MIL/uL   Hemoglobin 11.7 (*) 12.0 - 15.0 g/dL   HCT 29.537.0  28.436.0 - 13.246.0 %   MCV 81.5  78.0 - 100.0 fL   MCH 25.8 (*) 26.0 - 34.0 pg   MCHC 31.6  30.0 - 36.0 g/dL   RDW 44.015.5  10.211.5 - 72.515.5 %   Platelets 207  150 - 400 K/uL     MAU Course  Procedures  MDM  Phenergan 25 mg IM now Urine culture Called Dr Gaynell FaceMarshall who advised CBC, sending home with phenergan as she has gallstones and will be having gallbladder out after 6 weeks postpartum visit. He also advised referral to dietician for low fat diet and keep her appointment in his office on Monday at 10 am. She is to continue her labetalol 4 x day Pt was sleeping and when she woke up she was nauseated and vomited/ will try Zofran 8 mg ODT/ nausea is significantly improved  Assessment and Plan   A:  Nausea and Vomiting Hypertension Gall Stones  P: Phenergan 25 mg po q 8 hours prn nausea Zofran 8 mg ODT q8 hours for day time Dietician Consult for low fat diet Continue Labetalol 4 x day Keep appointment with Dr Gaynell FaceMarshall on Monday at 10 am Return to MAU as needed  [name], [name] 09/07/2014, 12:07 PM

## 2014-09-07 NOTE — MAU Note (Signed)
RN spoke with Britta MccreedyBarbara from [facility] [phone]) to set up outpatient nutritional dietician consult for gallbladder education. Outpatient order put in. Facility will call patient to set up appointment.

## 2014-09-10 LAB — URINE CULTURE

## 2014-10-09 ENCOUNTER — Encounter (HOSPITAL_COMMUNITY): Payer: Self-pay | Admitting: *Deleted

## 2015-11-05 LAB — CYTOLOGY - PAP: PAP SMEAR: NEGATIVE

## 2016-08-30 ENCOUNTER — Encounter: Payer: Self-pay | Admitting: *Deleted

## 2017-07-26 ENCOUNTER — Encounter (HOSPITAL_COMMUNITY): Payer: Self-pay | Admitting: *Deleted

## 2017-07-26 ENCOUNTER — Inpatient Hospital Stay (HOSPITAL_COMMUNITY)
Admission: AD | Admit: 2017-07-26 | Discharge: 2017-07-26 | Disposition: A | Discharge status: Home / Self Care | Payer: Medicaid Other | Source: Ambulatory Visit | Attending: Obstetrics & Gynecology | Admitting: Obstetrics & Gynecology

## 2017-07-26 DIAGNOSIS — N760 Acute vaginitis: Secondary | ICD-10-CM

## 2017-07-26 DIAGNOSIS — L089 Local infection of the skin and subcutaneous tissue, unspecified: Secondary | ICD-10-CM | POA: Insufficient documentation

## 2017-07-26 DIAGNOSIS — R030 Elevated blood-pressure reading, without diagnosis of hypertension: Secondary | ICD-10-CM | POA: Diagnosis not present

## 2017-07-26 DIAGNOSIS — Z3202 Encounter for pregnancy test, result negative: Secondary | ICD-10-CM | POA: Insufficient documentation

## 2017-07-26 DIAGNOSIS — R102 Pelvic and perineal pain: Secondary | ICD-10-CM | POA: Diagnosis present

## 2017-07-26 DIAGNOSIS — B9689 Other specified bacterial agents as the cause of diseases classified elsewhere: Secondary | ICD-10-CM

## 2017-07-26 LAB — URINALYSIS, ROUTINE W REFLEX MICROSCOPIC
Bacteria, UA: NONE SEEN
Bilirubin Urine: NEGATIVE
GLUCOSE, UA: NEGATIVE mg/dL
HGB URINE DIPSTICK: NEGATIVE
Ketones, ur: NEGATIVE mg/dL
Leukocytes, UA: NEGATIVE
Nitrite: NEGATIVE
PROTEIN: 30 mg/dL — AB
Specific Gravity, Urine: 1.029 (ref 1.005–1.030)
pH: 6 (ref 5.0–8.0)

## 2017-07-26 LAB — WET PREP, GENITAL
SPERM: NONE SEEN
Trich, Wet Prep: NONE SEEN
Yeast Wet Prep HPF POC: NONE SEEN

## 2017-07-26 LAB — POCT PREGNANCY, URINE: Preg Test, Ur: NEGATIVE

## 2017-07-26 MED ORDER — METRONIDAZOLE 500 MG PO TABS: 500.0000 mg | ORAL_TABLET | Freq: Two times a day (BID) | ORAL | 0 refills | Status: DC

## 2017-07-26 NOTE — Discharge Instructions (Signed)
Bacterial Vaginosis Bacterial vaginosis is an infection of the vagina. It happens when too many germs (bacteria) grow in the vagina. This infection puts you at risk for infections from sex (STIs). Treating this infection can lower your risk for some STIs. You should also treat this if you are pregnant. It can cause your baby to be born early. Follow these instructions at home: Medicines  Take over-the-counter and prescription medicines only as told by your doctor.  Take or use your antibiotic medicine as told by your doctor. Do not stop taking or using it even if you start to feel better. General instructions  If you your sexual partner is a woman, tell her that you have this infection. She needs to get treatment if she has symptoms. If you have a female partner, he does not need to be treated.  During treatment: ? Avoid sex. ? Do not douche. ? Avoid alcohol as told. ? Avoid breastfeeding as told.  Drink enough fluid to keep your pee (urine) clear or pale yellow.  Keep your vagina and butt (rectum) clean. ? Wash the area with warm water every day. ? Wipe from front to back after you use the toilet.  Keep all follow-up visits as told by your doctor. This is important. Preventing this condition  Do not douche.  Use only warm water to wash around your vagina.  Use protection when you have sex. This includes: ? Latex condoms. ? Dental dams.  Limit how many people you have sex with. It is best to only have sex with the same person (be monogamous).  Get tested for STIs. Have your partner get tested.  Wear underwear that is cotton or lined with cotton.  Avoid tight pants and pantyhose. This is most important in summer.  Do not use any products that have nicotine or tobacco in them. These include cigarettes and e-cigarettes. If you need help quitting, ask your doctor.  Do not use illegal drugs.  Limit how much alcohol you drink. Contact a doctor if:  Your symptoms do not get  better, even after you are treated.  You have more discharge or pain when you pee (urinate).  You have a fever.  You have pain in your belly (abdomen).  You have pain with sex.  Your bleed from your vagina between periods. Summary  This infection happens when too many germs (bacteria) grow in the vagina.  Treating this condition can lower your risk for some infections from sex (STIs).  You should also treat this if you are pregnant. It can cause early (premature) birth.  Do not stop taking or using your antibiotic medicine even if you start to feel better. This information is not intended to replace advice given to you by your health care provider. Make sure you discuss any questions you have with your health care provider. Document Released: 09/02/2008 Document Revised: 08/09/2016 Document Reviewed: 08/09/2016 Elsevier Interactive Patient Education  2017 [employer] Folliculitis Folliculitis is inflammation of the hair follicles. Folliculitis most commonly occurs on the scalp, thighs, legs, back, and buttocks. However, it can occur anywhere on the body. What are the causes? This condition may be caused by:  A bacterial infection (common).  A fungal infection.  A viral infection.  Coming into contact with certain chemicals, especially oils and tars.  Shaving or waxing.  Applying greasy ointments or creams to your skin often.  Long-lasting folliculitis and folliculitis that keeps coming back can be caused by bacteria that live in the nostrils.  What increases the risk? This condition is more likely to develop in people with:  A weakened immune system.  Diabetes.  Obesity.  What are the signs or symptoms? Symptoms of this condition include:  Redness.  Soreness.  Swelling.  Itching.  Small white or yellow, pus-filled, itchy spots (pustules) that appear over a reddened area. If there is an infection that goes deep into the follicle, these may develop into a  boil (furuncle).  A group of closely packed boils (carbuncle). These tend to form in hairy, sweaty areas of the body.  How is this diagnosed? This condition is diagnosed with a skin exam. To find what is causing the condition, your health care provider may take a sample of one of the pustules or boils for testing. How is this treated? This condition may be treated by:  Applying warm compresses to the affected areas.  Taking an antibiotic medicine or applying an antibiotic medicine to the skin.  Applying or bathing with an antiseptic solution.  Taking an over-the-counter medicine to help with itching.  Having a procedure to drain any pustules or boils. This may be done if a pustule or boil contains a lot of pus or fluid.  Laser hair removal. This may be done to treat long-lasting folliculitis.  Follow these instructions at home:  If directed, apply heat to the affected area as often as told by your health care provider. Use the heat source that your health care provider recommends, such as a moist heat pack or a heating pad. ? Place a towel between your skin and the heat source. ? Leave the heat on for 20-30 minutes. ? Remove the heat if your skin turns bright red. This is especially important if you are unable to feel pain, heat, or cold. You may have a greater risk of getting burned.  If you were prescribed an antibiotic medicine, use it as told by your health care provider. Do not stop using the antibiotic even if you start to feel better.  Take over-the-counter and prescription medicines only as told by your health care provider.  Do not shave irritated skin.  Keep all follow-up visits as told by your health care provider. This is important. Get help right away if:  You have more redness, swelling, or pain in the affected area.  Red streaks are spreading from the affected area.  You have a fever. This information is not intended to replace advice given to you by your  health care provider. Make sure you discuss any questions you have with your health care provider. Document Released: 02/02/2002 Document Revised: 06/13/2016 Document Reviewed: 09/14/2015 Elsevier Interactive Patient Education  2018 [employer].

## 2017-07-26 NOTE — MAU Provider Note (Signed)
History     CSN: [id number]  Arrival date and time: 07/26/17 1756   First Provider Initiated Contact with Patient 07/26/17 1912      Chief Complaint  Patient presents with  . Vaginal Discharge  . vaginal pain   HPI Amy [name] is 31 y.o. [id number] presents for evaluation of vaginal pain that began yesterday.  States she thinks she has a bump inside the vagina. Rates pain as 4/10.   + for vaginal discharge describes as white, thin and without odor.  Is sexually active 1 partner without contraception.  Neg for HSV and STI hx  Patient's BP is elevated.  States she has had elevated pressure in the past and her PCP suggested diet and exercise.  She does not have a PCP at this time.   Past Medical History:  Diagnosis Date  . HELLP syndrome    G4    Past Surgical History:  Procedure Laterality Date  . DILATION AND CURETTAGE OF UTERUS    . INDUCED ABORTION      Family History  Problem Relation Age of Onset  . Heart disease Father   . Heart disease Mother     Social History  Substance Use Topics  . Smoking status: Never Smoker  . Smokeless tobacco: Never Used  . Alcohol use Yes     Comment: socially    Allergies: No Known Allergies  Prescriptions Prior to Admission  Medication Sig Dispense Refill Last Dose  . labetalol (NORMODYNE) 200 MG tablet Take 200 mg by mouth 3 (three) times daily.   09/06/2014 at 1200  . ondansetron (ZOFRAN ODT) 8 MG disintegrating tablet Take 1 tablet (8 mg total) by mouth every 8 (eight) hours as needed for nausea or vomiting. 20 tablet 0   . oxyCODONE-acetaminophen (PERCOCET/ROXICET) 5-325 MG per tablet Take 1 tablet by mouth every 4 (four) hours as needed for moderate pain or severe pain.   09/06/2014 at Unknown time  . promethazine (PHENERGAN) 25 MG tablet Take 1 tablet (25 mg total) by mouth every 6 (six) hours as needed for nausea or vomiting. 30 tablet 0     Review of Systems  Constitutional: Negative for activity change, appetite change  and fever.  Respiratory: Negative for shortness of breath.   Cardiovascular: Negative for chest pain.  Gastrointestinal: Negative for abdominal pain.  Genitourinary: Positive for vaginal discharge and vaginal pain. Negative for dysuria, frequency and urgency.  Neurological: Negative for headaches.   Physical Exam   Blood pressure (!) 147/92, pulse 88, temperature 98.3 F (36.8 C), temperature source Oral, resp. rate 18, weight 167 lb (75.8 kg), last menstrual period 06/18/2017, SpO2 100 %, unknown if currently breastfeeding.  Physical Exam  Vitals reviewed. Constitutional: She is oriented to person, place, and time. She appears well-developed and well-nourished. No distress.  HENT:  Head: Normocephalic.  Neck: Normal range of motion.  Cardiovascular: Normal rate.   bp is elevated tonight.   Respiratory: Effort normal.  GI: Soft. She exhibits no distension and no mass. There is no tenderness. There is no rebound and no guarding.  Genitourinary: There is lesion (there is a small pustule that has come to a head without drainage in the upper half of the right labia minora.  Neg for ulcerations.  ) on the right labia. There is no rash, tenderness or injury on the right labia. There is no rash, tenderness, lesion or injury on the left labia. Uterus is not enlarged and not tender. Cervix exhibits no motion tenderness,  no discharge and no friability. Right adnexum displays no mass, no tenderness and no fullness. Left adnexum displays no mass, no tenderness and no fullness. No erythema, tenderness or bleeding in the vagina. No foreign body in the vagina. No signs of injury around the vagina. Vaginal discharge (small amount of white discharge with slight odor. ) found.  Neurological: She is alert and oriented to person, place, and time.  Skin: Skin is warm and dry.  Psychiatric: She has a normal mood and affect. Her behavior is normal. Thought content normal.   Results for orders placed or performed  during the hospital encounter of 07/26/17 (from the past 24 hour(s))  Urinalysis, Routine w reflex microscopic     Status: Abnormal   Collection Time: 07/26/17  6:05 PM  Result Value Ref Range   Color, Urine YELLOW YELLOW   APPearance HAZY (A) CLEAR   Specific Gravity, Urine 1.029 1.005 - 1.030   pH 6.0 5.0 - 8.0   Glucose, UA NEGATIVE NEGATIVE mg/dL   Hgb urine dipstick NEGATIVE NEGATIVE   Bilirubin Urine NEGATIVE NEGATIVE   Ketones, ur NEGATIVE NEGATIVE mg/dL   Protein, ur 30 (A) NEGATIVE mg/dL   Nitrite NEGATIVE NEGATIVE   Leukocytes, UA NEGATIVE NEGATIVE   RBC / HPF 0-5 0 - 5 RBC/hpf   WBC, UA 0-5 0 - 5 WBC/hpf   Bacteria, UA NONE SEEN NONE SEEN   Squamous Epithelial / LPF 6-30 (A) NONE SEEN   Mucous PRESENT   Pregnancy, urine POC     Status: None   Collection Time: 07/26/17  6:18 PM  Result Value Ref Range   Preg Test, Ur NEGATIVE NEGATIVE  Wet prep, genital     Status: Abnormal   Collection Time: 07/26/17  7:20 PM  Result Value Ref Range   Yeast Wet Prep HPF POC NONE SEEN NONE SEEN   Trich, Wet Prep NONE SEEN NONE SEEN   Clue Cells Wet Prep HPF POC PRESENT (A) NONE SEEN   WBC, Wet Prep HPF POC FEW (A) NONE SEEN   Sperm NONE SEEN    MAU Course  Procedures  GC/CHL/ Hiv results pending, will be contacted if any of these are positive  MDM MSE Exam Labs  Assessment and Plan  A:  Pustule on right labia minora      Bacterial vaginosis     Elevated blood pressure P:  Instructed to soak in warm water, gently squeeze and allow to drain.  No intercourse until it heals.  Discussed with patient this does not have the appearance of HSV.     Rx for BV sent to pharmacy.       Encouraged her to establish GYN care.      Suggested she reestablish care with her PCP and have her BP monitored and treated, she agreed.        Dennison Mascot Amy [name] 07/26/2017, 7:52 PM

## 2017-07-26 NOTE — MAU Note (Signed)
+  vaginal pain; started yesterday Sore  States she feels a "bump" internally Rating pain 4/10 ; none now just uncomfortable  Denies vaginal bleeding  +vaginal discharge White Thin  No odor  LMP 7/12

## 2017-07-27 LAB — GC/CHLAMYDIA PROBE AMP (~~LOC~~) NOT AT ARMC
Chlamydia: NEGATIVE
Neisseria Gonorrhea: NEGATIVE

## 2017-07-27 LAB — HIV ANTIBODY (ROUTINE TESTING W REFLEX): HIV Screen 4th Generation wRfx: NONREACTIVE

## 2018-01-06 ENCOUNTER — Encounter (HOSPITAL_COMMUNITY): Payer: Self-pay

## 2018-01-06 ENCOUNTER — Other Ambulatory Visit: Payer: Self-pay

## 2018-01-06 ENCOUNTER — Emergency Department (HOSPITAL_COMMUNITY)
Admission: EM | Admit: 2018-01-06 | Discharge: 2018-01-06 | Disposition: A | Discharge status: Home / Self Care | Payer: Medicaid Other | Attending: Emergency Medicine | Admitting: Emergency Medicine

## 2018-01-06 DIAGNOSIS — R42 Dizziness and giddiness: Secondary | ICD-10-CM | POA: Insufficient documentation

## 2018-01-06 DIAGNOSIS — H5310 Unspecified subjective visual disturbances: Secondary | ICD-10-CM | POA: Insufficient documentation

## 2018-01-06 DIAGNOSIS — H539 Unspecified visual disturbance: Secondary | ICD-10-CM

## 2018-01-06 NOTE — ED Notes (Signed)
Patient ambulatory to bathroom with steady gait at this time 

## 2018-01-06 NOTE — ED Notes (Signed)
Patient verbalizes understanding of discharge instructions. Opportunity for questioning and answers were provided. Armband removed by staff, pt discharged from ED ambulatory.   

## 2018-01-06 NOTE — Discharge Instructions (Signed)
Please read instructions below. Report to Dr. Orion CrookMarchase's office tomorrow morning for your appointment at 9am. [facility].

## 2018-01-06 NOTE — ED Provider Notes (Signed)
[facility] EMERGENCY DEPARTMENT Provider Note   CSN: [id number] Arrival date & time: 01/06/18  1416     History   Chief Complaint Chief Complaint  Patient presents with  . Visual Field Change    HPI Amy [name] is a 32 y.o. female presenting to the ED with gradual onset of black floaters in the left visual field that began Monday evening.  Patient states she constantly sees black specks floating, only out of her left eye.  Denies any recent head trauma or eye injury.  Denies slurred speech, facial droop, weakness of extremities, headache, vision changes.  Does report some mild intermittent nausea and lightheadedness.   The history is provided by the patient.    Past Medical History:  Diagnosis Date  . HELLP syndrome    G4    Patient Active Problem List   Diagnosis Date Noted  . Normal delivery 09/02/2014  . Severe preeclampsia 09/02/2014  . Pregnancy 09/01/2014  . Protein-calorie malnutrition, severe (HCC) 09/01/2014  . Elevated liver enzymes 08/31/2014    Past Surgical History:  Procedure Laterality Date  . DILATION AND CURETTAGE OF UTERUS    . INDUCED ABORTION      OB History    Gravida Para Term Preterm AB Living   4 1   1 3 1    SAB TAB Ectopic Multiple Live Births   1 2     1        Home Medications    Prior to Admission medications   Medication Sig Start Date End Date Taking? Authorizing Provider  Phenylephrine-APAP-Guaifenesin (TYLENOL COLD & HEAD PO) Take 1 tablet by mouth as needed. Cold and Sinus   Yes [provider]  Pseudoephedrine-DM-GG-APAP (THERAFLU MAX-D COLD & FLU PO) Take 30 mLs by mouth daily as needed.   Yes [provider]  metroNIDAZOLE (FLAGYL) 500 MG tablet Take 1 tablet (500 mg total) by mouth 2 (two) times daily. Patient not taking: Reported on 01/06/2018 07/26/17   Key, Verita Schneiders, NP    Family History Family History  Problem Relation Age of Onset  . Heart disease Father   . Heart disease  Mother     Social History Social History   Tobacco Use  . Smoking status: Never Smoker  . Smokeless tobacco: Never Used  Substance Use Topics  . Alcohol use: Yes    Comment: socially  . Drug use: No     Allergies   Patient has no known allergies.   Review of Systems Review of Systems  Eyes: Negative for photophobia and pain.       Black floaters  Neurological: Positive for light-headedness. Negative for syncope, facial asymmetry, speech difficulty, weakness and headaches.  Hematological: Does not bruise/bleed easily.  Psychiatric/Behavioral: Negative for confusion.     Physical Exam Updated Vital Signs BP 106/89   Pulse 79   Temp 99.4 F (37.4 C) (Oral)   Resp 16   Ht 5\' 2"  (1.575 m)   Wt 77.1 kg (170 lb)   SpO2 99%   BMI 31.09 kg/m   Physical Exam  Constitutional: She is oriented to person, place, and time. She appears well-developed and well-nourished. No distress.  HENT:  Head: Normocephalic and atraumatic.  Eyes: Conjunctivae, EOM and lids are normal. Pupils are equal, round, and reactive to light.  Possible posterior vitreous defect visualized on fundoscopic exam of left.  Neck: Normal range of motion. Neck supple.  Cardiovascular: Normal rate, regular rhythm, normal heart sounds and intact distal  pulses.  Pulmonary/Chest: Effort normal.  Abdominal: Soft.  Neurological: She is alert and oriented to person, place, and time.  Mental Status:  Alert, oriented, thought content appropriate, able to give a coherent history. Speech fluent without evidence of aphasia. Able to follow 2 step commands without difficulty.  Cranial Nerves:  II:  Peripheral visual fields grossly normal, pupils equal, round, reactive to light III,IV, VI: ptosis not present, extra-ocular motions intact bilaterally  V,VII: smile symmetric, facial light touch sensation equal VIII: hearing grossly normal to voice  X: uvula elevates symmetrically  XI: bilateral shoulder shrug symmetric  and strong XII: midline tongue extension without fassiculations Motor:  Normal tone. 5/5 in upper and lower extremities bilaterally including strong and equal grip strength and dorsiflexion/plantar flexion Sensory: Pinprick and light touch normal in all extremities.  Deep Tendon Reflexes: 2+ and symmetric in the biceps and patella Cerebellar: normal finger-to-nose with bilateral upper extremities Gait: normal gait and balance CV: distal pulses palpable throughout    Skin: Skin is warm.  Psychiatric: She has a normal mood and affect. Her behavior is normal.  Nursing note and vitals reviewed.    ED Treatments / Results  Labs (all labs ordered are listed, but only abnormal results are displayed) Labs Reviewed - No data to display  EKG  EKG Interpretation None       Radiology No results found.  Procedures Procedures (including critical care time)  Medications Ordered in ED Medications - No data to display   Initial Impression / Assessment and Plan / ED Course  I have reviewed the triage vital signs and the nursing notes.  Pertinent labs & imaging results that were available during my care of the patient were reviewed by me and considered in my medical decision making (see chart for details).  Clinical Course as of Jan 07 110  Wed Jan 06, 2018  2041 Spoke with Dr. Robin SearingMarchase with Ophthalmology, who states presentation is likely posterior vitreous detachment vs vitreous hemorrhage. He recommends no further interventions in Ed, and for pt to report to his office tomorrow morning at 9am. Plan discussed w pt who is agreeable to plan.  [JR]    Clinical Course User Index [JR] Robinson, [place] N, PA-C    Pt presenting with symptoms suggestive of retinal detachment vs vitreous hemorrhage. No focal neuro deficits, no stroke-like symptoms. Visual acuity 20/20 bilaterally. Fundoscopic exam performed by both myself and Dr. Madilyn Hookees with possible vitreous defect visualized. Pt initially  mildly hypertensive, however w normal BP on recheck. No other risk factors. Pt discussed with ophthalmologist, Dr. Robin SearingMarchase, who recommends no further interventions in ED, and outpatient follow up in his clinic tmrrw morning. Discussed this plan with patient, pt agreeable. No other complaints, well-appearing, safe for discharge.  Patient discussed with and seen by Dr. Madilyn Hookees.  Discussed results, findings, treatment and follow up. Patient advised of return precautions. Patient verbalized understanding and agreed with plan.  Final Clinical Impressions(s) / ED Diagnoses   Final diagnoses:  Visual disturbance    ED Discharge Orders    None       Robinson, [place] N, PA-C 01/07/18 0112    Tilden Fossaees, Elizabeth, MD 01/08/18 1329

## 2018-01-06 NOTE — ED Triage Notes (Addendum)
PT reports seeing "black specks" in left eye vision since Monday night. PT reports they appear to be floating. Endorses nausea and dizziness. Denies vomiting

## 2018-03-02 ENCOUNTER — Encounter: Payer: Self-pay | Admitting: Neurology

## 2018-03-02 ENCOUNTER — Telehealth: Payer: Self-pay | Admitting: Neurology

## 2018-03-02 ENCOUNTER — Ambulatory Visit: Payer: Medicaid Other | Admitting: Neurology

## 2018-03-02 VITALS — BP 137/85 | HR 80 | Ht 62.0 in | Wt 168.0 lb

## 2018-03-02 DIAGNOSIS — H4710 Unspecified papilledema: Secondary | ICD-10-CM

## 2018-03-02 DIAGNOSIS — R51 Headache with orthostatic component, not elsewhere classified: Secondary | ICD-10-CM

## 2018-03-02 DIAGNOSIS — G932 Benign intracranial hypertension: Secondary | ICD-10-CM

## 2018-03-02 DIAGNOSIS — R519 Headache, unspecified: Secondary | ICD-10-CM

## 2018-03-02 DIAGNOSIS — H547 Unspecified visual loss: Secondary | ICD-10-CM

## 2018-03-02 DIAGNOSIS — H05119 Granuloma of unspecified orbit: Secondary | ICD-10-CM

## 2018-03-02 DIAGNOSIS — H532 Diplopia: Secondary | ICD-10-CM

## 2018-03-02 DIAGNOSIS — G8929 Other chronic pain: Secondary | ICD-10-CM | POA: Insufficient documentation

## 2018-03-02 MED ORDER — ACETAZOLAMIDE 250 MG PO TABS: 250.0000 mg | ORAL_TABLET | Freq: Two times a day (BID) | ORAL | 6 refills | Status: DC

## 2018-03-02 NOTE — Telephone Encounter (Signed)
Medicaid order sent to GI they will obtain auth & contact the pt to schedule.

## 2018-03-02 NOTE — Progress Notes (Signed)
[facility]    Provider:  Dr Lucia Gaskins Referring Provider: Elwin Mocha* Primary Care Physician:  Elwin Mocha*  CC:  Disk edema  HPI:  Amy [name] is a 32 y.o. female here as a referral from Dr. Sherryll Burger for optic nerve edema. PMHx Obesity.  Saw Dr. Sherryll Burger because of floaters in the eyes. Resolved since then. She has frequent headaches, she has had headaches for years thought it was due to long hours at work. The headaches are dull or sometimes very intense and severe, a few years it was so severe she went to the emergency room 4 years ago. She has 12 headache days a month, can also be in the right unilateral. No light or sound sensitivity or nausea. More of a pressure on the head. Worse in the morning she can wake up with a headache and lasts a few hours. Ibuprofen may help a little bit. She has hearing changes, her left ear felt full but no pulsating in the ears. She has episodes of blurry vision. Worse positionally with eye pain. No other focal neurologic deficits, associated symptoms, inciting events or modifiable factors. No recent weight gain, no IUDs, no vitamin A derivatives or antibiotics. No other focal neurologic deficits, associated symptoms, inciting events or modifiable factors.  Reviewed notes, labs and imaging from outside physicians, which showed:  Reviewed Ophthalmology notes and exam: Exam showed bilateral disc edema otherwise unremarkable  Review of Systems: Patient complains of symptoms per HPI as well as the following symptoms: headache, vision changes. Pertinent negatives and positives per HPI. All others negative.   Social History   Socioeconomic History  . Marital status: Single    Spouse name: Not on file  . Number of children: Not on file  . Years of education: Not on file  . Highest education level: Not on file  Occupational History  . Not on file  Social Needs  . Financial resource strain: Not on file  . Food  insecurity:    Worry: Not on file    Inability: Not on file  . Transportation needs:    Medical: Not on file    Non-medical: Not on file  Tobacco Use  . Smoking status: Never Smoker  . Smokeless tobacco: Never Used  Substance and Sexual Activity  . Alcohol use: Yes    Comment: socially  . Drug use: No  . Sexual activity: Yes    Birth control/protection: None  Lifestyle  . Physical activity:    Days per week: Not on file    Minutes per session: Not on file  . Stress: Not on file  Relationships  . Social connections:    Talks on phone: Not on file    Gets together: Not on file    Attends religious service: Not on file    Active member of club or organization: Not on file    Attends meetings of clubs or organizations: Not on file    Relationship status: Not on file  . Intimate partner violence:    Fear of current or ex partner: Not on file    Emotionally abused: Not on file    Physically abused: Not on file    Forced sexual activity: Not on file  Other Topics Concern  . Not on file  Social History Narrative  . Not on file    Family History  Problem Relation Age of Onset  . Heart disease Father   . Heart disease Mother   . Headache Neg  Hx     Past Medical History:  Diagnosis Date  . HELLP syndrome    G4    Past Surgical History:  Procedure Laterality Date  . DILATION AND CURETTAGE OF UTERUS    . INDUCED ABORTION      Current Outpatient Medications  Medication Sig Dispense Refill  . acetaZOLAMIDE (DIAMOX) 250 MG tablet Take 1 tablet (250 mg total) by mouth 2 (two) times daily. 60 tablet 6   No current facility-administered medications for this visit.     Allergies as of 03/02/2018  . (No Known Allergies)    Vitals: BP 137/85   Pulse 80   Ht 5\' 2"  (1.575 m)   Wt 168 lb (76.2 kg)   BMI 30.73 kg/m  Last Weight:  Wt Readings from Last 1 Encounters:  03/02/18 168 lb (76.2 kg)   Last Height:   Ht Readings from Last 1 Encounters:  03/02/18 5\' 2"   (1.575 m)    Physical exam: Exam: Gen: NAD, conversant, well nourised, obese, well groomed                     CV: RRR, no MRG. No Carotid Bruits. No peripheral edema, warm, nontender Eyes: Conjunctivae clear without exudates or hemorrhage  Neuro: Detailed Neurologic Exam  Speech:    Speech is normal; fluent and spontaneous with normal comprehension.  Cognition:    The patient is oriented to person, place, and time;     recent and remote memory intact;     language fluent;     normal attention, concentration,     fund of knowledge Cranial Nerves:    The pupils are equal, round, and reactive to light. The fundi are normal and spontaneous venous pulsations are present. Visual fields are full to finger confrontation. Extraocular movements are intact. Trigeminal sensation is intact and the muscles of mastication are normal. The face is symmetric. The palate elevates in the midline. Hearing intact. Voice is normal. Shoulder shrug is normal. The tongue has normal motion without fasciculations.   Coordination:    Normal finger to nose and heel to shin. Normal rapid alternating movements.   Gait:    Heel-toe and tandem gait are normal.   Motor Observation:    No asymmetry, no atrophy, and no involuntary movements noted. Tone:    Normal muscle tone.    Posture:    Posture is normal. normal erect    Strength:    Strength is V/V in the upper and lower limbs.      Sensation: intact to LT     Reflex Exam:  DTR's:    Deep tendon reflexes in the upper and lower extremities are normal bilaterally.   Toes:    The toes are downgoing bilaterally.   Clonus:    Clonus is absent.      Assessment/Plan:  32 year old with bilateral optic nerve head edema.   Needs MRI brain and MRI orbits due to concerning symptoms of bilateral papilledema, vision loss, positional headache, hearing loss to evaluate for space-occupying lesion, severe Chiari, intracranial hypertension or other lesion that  could cause the symptoms, also orbital pseudotumor given eye pain. MRV of the brain needed to evaluate for cerebral venous thrombosis that could be causing elevated intracranial hypertension papilledema. We will check labs today. LP for opening pressure Discussed Diamox and and side effects of Diamox, instructed not to pick up the prescription until the diagnosis is verified, we will call her with the above.  Also discussed intracranial hypertension, idiopathic, associated weight, risk of permanent vision loss, weight loss.  The patient has acute changes she needs to proceed to emergency room immediately.  Orders Placed This Encounter  Procedures  . MR BRAIN W WO CONTRAST  . MR ORBITS W WO CONTRAST  . MR MRV HEAD WO CM  . DG FLUORO GUIDED LOC OF NEEDLE/CATH TIP FOR SPINAL INJECT LT  . Comprehensive metabolic panel  . CBC  . TSH   Discussed: To prevent or relieve headaches, try the following: Cool Compress. Lie down and place a cool compress on your head.  Avoid headache triggers. If certain foods or odors seem to have triggered your migraines in the past, avoid them. A headache diary might help you identify triggers.  Include physical activity in your daily routine. Try a daily walk or other moderate aerobic exercise.  Manage stress. Find healthy ways to cope with the stressors, such as delegating tasks on your to-do list.  Practice relaxation techniques. Try deep breathing, yoga, massage and visualization.  Eat regularly. Eating regularly scheduled meals and maintaining a healthy diet might help prevent headaches. Also, drink plenty of fluids.  Follow a regular sleep schedule. Sleep deprivation might contribute to headaches Consider biofeedback. With this mind-body technique, you learn to control certain bodily functions - such as muscle tension, heart rate and blood pressure - to prevent headaches or reduce headache pain.    Proceed to emergency room if you experience new or worsening  symptoms or symptoms do not resolve, if you have new neurologic symptoms or if headache is severe, or for any concerning symptom.   Naomie DeanAntonia Ahern, MD  [facility] [address] [place], [place] [id number]  Phone [phone] Fax [phone]

## 2018-03-02 NOTE — Patient Instructions (Addendum)
MRI of the brain and lumbar puncture   Idiopathic Intracranial Hypertension Idiopathic intracranial hypertension (IIH) is a condition that increases pressure around the brain. The fluid that surrounds the brain and spinal cord (cerebrospinal fluid, CSF) increases and causes the pressure. Idiopathic means that the cause of this condition is not known. IIH affects the brain and spinal cord (is a neurological disorder). If this condition is not treated, it can cause vision loss or blindness. What increases the risk? You are more likely to develop this condition if:  You are severely overweight (obese).  You are a woman who has not gone through menopause.  You take certain medicines, such as birth control or steroids.  What are the signs or symptoms? Symptoms of IIH include:  Headaches. This is the most common symptom.  Pain in the shoulders or neck.  Nausea and vomiting.  A "rushing water" or pulsing sound within the ears (pulsatile tinnitus).  Double vision.  Blurred vision.  Brief episodes of complete vision loss.  How is this diagnosed? This condition may be diagnosed based on:  Your symptoms.  Your medical history.  CT scan of the brain.  MRI of the brain.  Magnetic resonance venogram (MRV) to check veins in the brain.  Diagnostic lumbar puncture. This is a procedure to remove and examine a sample of cerebrospinal fluid. This procedure can determine whether too much fluid may be causing IIH.  A thorough eye exam to check for swelling or nerve damage in the eyes.  How is this treated? Treatment for this condition depends on your symptoms. The goal of treatment is to decrease the pressure around your brain. Common treatments include:  Medicines to decrease the production of spinal fluid and lower the pressure within your skull.  Medicines to prevent or treat headaches.  Surgery to place drains (shunts) in your brain to remove excess fluid.  Lumbar puncture to  remove excess cerebrospinal fluid.  Follow these instructions at home:  If you are overweight or obese, work with your health care provider to lose weight.  Take over-the-counter and prescription medicines only as told by your health care provider.  Do not drive or use heavy machinery while taking medicines that can make you sleepy.  Keep all follow-up visits as told by your health care provider. This is important. Contact a health care provider if:  You have changes in your vision, such as: ? Double vision. ? Not being able to see colors (color vision). Get help right away if:  You have any of the following symptoms and they get worse or do not get better. ? Headaches. ? Nausea. ? Vomiting. ? Vision changes or difficulty seeing. Summary  Idiopathic intracranial hypertension (IIH) is a condition that increases pressure around the brain. The cause is not known (is idiopathic).  The most common symptom of IIH is headaches.  Treatment may include medicines or surgery to relieve the pressure on your brain. This information is not intended to replace advice given to you by your health care provider. Make sure you discuss any questions you have with your health care provider. Document Released: 02/02/2002 Document Revised: 10/15/2016 Document Reviewed: 10/15/2016 Elsevier Interactive Patient Education  2017 Elsevier Inc.  Acetazolamide tablets What is this medicine? ACETAZOLAMIDE (a set a ZOLE a mide) is used to treat glaucoma and some seizure disorders. It may be used to treat edema or swelling from heart failure or from other medicines. This medicine is also used to treat and to prevent altitude  or mountain sickness. This medicine may be used for other purposes; ask your health care provider or pharmacist if you have questions. COMMON BRAND NAME(S): Diamox What should I tell my health care provider before I take this medicine? They need to know if you have any of these  conditions: -diabetes -kidney disease -liver disease -lung disease -an unusual or allergic reaction to acetazolamide, sulfa drugs, other medicines, foods, dyes, or preservatives -pregnant or trying to get pregnant -breast-feeding How should I use this medicine? Take this medicine by mouth with a glass of water. Follow the directions on the prescription label. Take this medicine with food if it upsets your stomach. Take your doses at regular intervals. Do not take your medicine more often than directed. Do not stop taking except on your doctor's advice. Talk to your pediatrician regarding the use of this medicine in children. Special care may be needed. Patients over 15 years old may have a stronger reaction and need a smaller dose. Overdosage: If you think you have taken too much of this medicine contact a poison control center or emergency room at once. NOTE: This medicine is only for you. Do not share this medicine with others. What if I miss a dose? If you miss a dose, take it as soon as you can. If it is almost time for your next dose, take only that dose. Do not take double or extra doses. What may interact with this medicine? Do not take this medicine with any of the following medications: -methazolamide This medicine may also interact with the following medications: -aspirin and aspirin-like medicines -cyclosporine -lithium -medicine for diabetes -methenamine -other diuretics -phenytoin -primidone -quinidine -sodium bicarbonate -stimulant medicines like dextroamphetamine This list may not describe all possible interactions. Give your health care provider a list of all the medicines, herbs, non-prescription drugs, or dietary supplements you use. Also tell them if you smoke, drink alcohol, or use illegal drugs. Some items may interact with your medicine. What should I watch for while using this medicine? Visit your doctor or health care professional for regular checks on your  progress. You will need blood work done regularly. If you are diabetic, check your blood sugar as directed. You may need to be on a special diet while taking this medicine. Ask your doctor. Also, ask how many glasses of fluid you need to drink a day. You must not get dehydrated. You may get drowsy or dizzy. Do not drive, use machinery, or do anything that needs mental alertness until you know how this medicine affects you. Do not stand or sit up quickly, especially if you are an older patient. This reduces the risk of dizzy or fainting spells. This medicine can make you more sensitive to the sun. Keep out of the sun. If you cannot avoid being in the sun, wear protective clothing and use sunscreen. Do not use sun lamps or tanning beds/booths. What side effects may I notice from receiving this medicine? Side effects that you should report to your doctor or health care professional as soon as possible: -allergic reactions like skin rash, itching or hives, swelling of the face, lips, or tongue -breathing problems -confusion, depression -dark urine -fever -numbness, tingling in hands or feet -redness, blistering, peeling or loosening of the skin, including inside the mouth -ringing in the ears -seizures -unusually weak or tired -yellowing of the eyes or skin Side effects that usually do not require medical attention (report to your doctor or health care professional if they continue or  are bothersome): -change in taste -diarrhea -headache -loss of appetite -nausea, vomiting -passing urine more often This list may not describe all possible side effects. Call your doctor for medical advice about side effects. You may report side effects to FDA at 1-800-FDA-1088. Where should I keep my medicine? Keep out of the reach of children. Store at room temperature between 20 and 25 degrees C (68 and 77 degrees F). Throw away any unused medicine after the expiration date. NOTE: This sheet is a summary. It  may not cover all possible information. If you have questions about this medicine, talk to your doctor, pharmacist, or health care provider.  2018 Elsevier/Gold Standard (2008-02-16 10:59:40)

## 2018-03-11 ENCOUNTER — Telehealth: Payer: Self-pay | Admitting: Neurology

## 2018-03-11 NOTE — Telephone Encounter (Signed)
She called asking to see if the lumbar puncture could be scheduled later so she would not have to miss 2 days of work in a row (MRI scheduled Monday, LP scheduled Tuesday according to her) I let her know that it would be best to get both of the studies done due to the swelling in the back of her eyes that could lead to permanent vision changes.  She expressed understanding and will follow the plan already in place.    I did advise her to go to the emergency room if she has more sudden or rapidly worsening vision changes.

## 2018-03-11 NOTE — Telephone Encounter (Signed)
We need the results of the LP before she starts the diamox. thanks

## 2018-03-11 NOTE — Telephone Encounter (Signed)
Pt requesting a call back to discuss her upcoming MRI and LP.

## 2018-03-11 NOTE — Telephone Encounter (Addendum)
Attempted to call the patient back. VM box not set up.   Per Dr. Lucia GaskinsAhern, pt can r/s LP however if the patient has IIH it can cause permanent vision loss. It is the patient's choice however. If vision worsens proceed to ED immediately and do not ignore. Also, do not start Diamox until we have the results of the LP.

## 2018-03-11 NOTE — Telephone Encounter (Addendum)
Spoke with patient. She wanted to clarify when she should start taking Diamox. Per. Ahern's note, pt is not to start Diamox until she has a formal diagnosis. MRI & LP are put of workup to determine diagnosis. The patient verbalized understanding of all and is aware not to start Diamox yet. She asked why should could not have the testing done on the same day. She is considering r/s the LP for later even weeks out. At this time she will keep testing as scheduled for 4/8 and 4/9. RN advised will call pt back & d/w Dr. Lucia GaskinsAhern.

## 2018-03-15 ENCOUNTER — Ambulatory Visit
Admission: RE | Admit: 2018-03-15 | Discharge: 2018-03-15 | Disposition: A | Discharge status: Home / Self Care | Payer: Medicaid Other | Source: Ambulatory Visit | Attending: Neurology | Admitting: Neurology

## 2018-03-15 DIAGNOSIS — R519 Headache, unspecified: Secondary | ICD-10-CM

## 2018-03-15 DIAGNOSIS — R51 Headache with orthostatic component, not elsewhere classified: Secondary | ICD-10-CM

## 2018-03-15 DIAGNOSIS — G932 Benign intracranial hypertension: Secondary | ICD-10-CM

## 2018-03-15 DIAGNOSIS — H547 Unspecified visual loss: Secondary | ICD-10-CM

## 2018-03-15 DIAGNOSIS — H05119 Granuloma of unspecified orbit: Secondary | ICD-10-CM

## 2018-03-15 DIAGNOSIS — H4710 Unspecified papilledema: Secondary | ICD-10-CM

## 2018-03-15 DIAGNOSIS — H532 Diplopia: Secondary | ICD-10-CM

## 2018-03-15 MED ORDER — GADOBENATE DIMEGLUMINE 529 MG/ML IV SOLN
15.0000 mL | Freq: Once | INTRAVENOUS | Status: AC | PRN
  Administered 2018-03-15: 15 mL via INTRAVENOUS

## 2018-03-16 ENCOUNTER — Ambulatory Visit
Admission: RE | Admit: 2018-03-16 | Discharge: 2018-03-16 | Disposition: A | Discharge status: Home / Self Care | Payer: Medicaid Other | Source: Ambulatory Visit | Attending: Neurology | Admitting: Neurology

## 2018-03-16 VITALS — BP 121/68 | HR 64

## 2018-03-16 DIAGNOSIS — G932 Benign intracranial hypertension: Secondary | ICD-10-CM

## 2018-03-16 LAB — CSF CELL COUNT WITH DIFFERENTIAL
RBC Count, CSF: 2 cells/uL (ref 0–10)
WBC, CSF: 1 cells/uL (ref 0–5)

## 2018-03-16 LAB — GLUCOSE, CSF: Glucose, CSF: 75 mg/dL (ref 40–80)

## 2018-03-16 LAB — PROTEIN, CSF: Total Protein, CSF: 21 mg/dL (ref 15–45)

## 2018-03-16 NOTE — Discharge Instructions (Signed)

## 2018-03-17 ENCOUNTER — Telehealth: Payer: Self-pay | Admitting: *Deleted

## 2018-03-17 NOTE — Telephone Encounter (Addendum)
Called the patient and discussed results as follows: MRI brain, orbits and MRV are unremarkable. But there is evidence of too much fluid in the brain. With that being said, the patient's LP did show an opening pressure significantly elevated at 39 (almost twice the upper limit of normal). Per Dr. Lucia GaskinsAhern, start the Diamox 250 mg 2 times daily and she would like to see the patient in the office in 4-6 weeks. Pt was scheduled in June, so RN r/s the patient for 5/20 @ 1:00 arrival time 12:30. Reviewed common side effects of Diamox including numbness/tingling in the fingers & toes and that sodas may taste flat or funny. Advised pt to view full insert with medication including full list of side effects. The patient verbalized understanding of all. She was advised to call back with any questions. She verbalized appreciation.    ----- Message from Anson FretAntonia B Ahern, MD sent at 03/15/2018  4:13 PM EDT ----- MRI brain, orbits and MRV are unremarkable. But there is evidence of too much fluid in the brain. So will await the results of the lumbar puncture tomorrow  Notes recorded by Anson FretAhern, Antonia B, MD on 03/16/2018 at 5:12 PM EDT Opening pressure significantly elevated at 39 (almost twice the upper limit of normal). Please have her start Diamox and see me in 4-6 weeks if she doesn't already have an appointment. thanks

## 2018-04-26 ENCOUNTER — Encounter: Payer: Self-pay | Admitting: Neurology

## 2018-04-26 ENCOUNTER — Ambulatory Visit: Payer: Medicaid Other | Admitting: Neurology

## 2018-04-26 VITALS — BP 141/93 | HR 87 | Ht 62.0 in | Wt 164.0 lb

## 2018-04-26 DIAGNOSIS — G932 Benign intracranial hypertension: Secondary | ICD-10-CM | POA: Insufficient documentation

## 2018-04-26 DIAGNOSIS — R51 Headache: Secondary | ICD-10-CM | POA: Diagnosis not present

## 2018-04-26 DIAGNOSIS — H4710 Unspecified papilledema: Secondary | ICD-10-CM | POA: Diagnosis not present

## 2018-04-26 DIAGNOSIS — R519 Headache, unspecified: Secondary | ICD-10-CM

## 2018-04-26 MED ORDER — ACETAZOLAMIDE 250 MG PO TABS: ORAL_TABLET | ORAL | 6 refills | Status: DC

## 2018-04-26 NOTE — Patient Instructions (Signed)
Idiopathic Intracranial Hypertension Idiopathic intracranial hypertension (IIH) is a condition that increases pressure around the brain. The fluid that surrounds the brain and spinal cord (cerebrospinal fluid, CSF) increases and causes the pressure. Idiopathic means that the cause of this condition is not known. IIH affects the brain and spinal cord (is a neurological disorder). If this condition is not treated, it can cause vision loss or blindness. What increases the risk? You are more likely to develop this condition if:  You are severely overweight (obese).  You are a woman who has not gone through menopause.  You take certain medicines, such as birth control or steroids.  What are the signs or symptoms? Symptoms of IIH include:  Headaches. This is the most common symptom.  Pain in the shoulders or neck.  Nausea and vomiting.  A "rushing water" or pulsing sound within the ears (pulsatile tinnitus).  Double vision.  Blurred vision.  Brief episodes of complete vision loss.  How is this diagnosed? This condition may be diagnosed based on:  Your symptoms.  Your medical history.  CT scan of the brain.  MRI of the brain.  Magnetic resonance venogram (MRV) to check veins in the brain.  Diagnostic lumbar puncture. This is a procedure to remove and examine a sample of cerebrospinal fluid. This procedure can determine whether too much fluid may be causing IIH.  A thorough eye exam to check for swelling or nerve damage in the eyes.  How is this treated? Treatment for this condition depends on your symptoms. The goal of treatment is to decrease the pressure around your brain. Common treatments include:  Medicines to decrease the production of spinal fluid and lower the pressure within your skull.  Medicines to prevent or treat headaches.  Surgery to place drains (shunts) in your brain to remove excess fluid.  Lumbar puncture to remove excess cerebrospinal  fluid.  Follow these instructions at home:  If you are overweight or obese, work with your health care provider to lose weight.  Take over-the-counter and prescription medicines only as told by your health care provider.  Do not drive or use heavy machinery while taking medicines that can make you sleepy.  Keep all follow-up visits as told by your health care provider. This is important. Contact a health care provider if:  You have changes in your vision, such as: ? Double vision. ? Not being able to see colors (color vision). Get help right away if:  You have any of the following symptoms and they get worse or do not get better. ? Headaches. ? Nausea. ? Vomiting. ? Vision changes or difficulty seeing. Summary  Idiopathic intracranial hypertension (IIH) is a condition that increases pressure around the brain. The cause is not known (is idiopathic).  The most common symptom of IIH is headaches.  Treatment may include medicines or surgery to relieve the pressure on your brain. This information is not intended to replace advice given to you by your health care provider. Make sure you discuss any questions you have with your health care provider. Document Released: 02/02/2002 Document Revised: 10/15/2016 Document Reviewed: 10/15/2016 Elsevier Interactive Patient Education  2017 Elsevier Inc.  

## 2018-04-26 NOTE — Progress Notes (Addendum)
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  Past medical history obesity.  Opening pressure was 39 cm of water.  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  MRV was unremarkable. Diamox  bid was started. She is taking the diamox. Headaches are improved. She did have a headache yesterday after getting her hair done. No floaters. Discussed this condition again, she is exercising and now has a nutritionist.   HPI:  Amy [name] is a 32 y.o. female here as a referral from Dr. Sherryll Burger for optic nerve edema. PMHx Obesity.  Saw Dr. Sherryll Burger because of floaters in the eyes. Resolved since then. She has frequent headaches, she has had headaches for years thought it was due to long hours at work. The headaches are dull or sometimes very intense and severe, a few years it was so severe she went to the emergency room 4 years ago. She has 12 headache days a month, can also be in the right unilateral. No light or sound sensitivity or nausea. More of a pressure on the head. Worse in the morning she can wake up with a headache and lasts a few hours. Ibuprofen may help a little bit. She has hearing changes, her left ear felt full but no pulsating in the ears. She has episodes of blurry vision. Worse positionally with eye pain. No other focal neurologic deficits, associated symptoms, inciting events or modifiable factors. No recent weight gain, no IUDs, no vitamin A derivatives or antibiotics. No other focal neurologic deficits, associated symptoms, inciting events or modifiable factors.  Reviewed notes, labs and imaging from outside physicians, which showed:  Reviewed Ophthalmology notes and exam: Exam showed bilateral disc  edema otherwise unremarkable  Review of Systems: Patient complains of symptoms per HPI as well as the following symptoms: headache, vision changes. Pertinent negatives and positives per HPI. All others negative.   Social History   Socioeconomic History  . Marital status: Single    Spouse name: Not on file  . Number of children: 1  . Years of education: Not on file  . Highest education level: Not on file  Occupational History  . Not on file  Social Needs  . Financial resource strain: Not on file  . Food insecurity:    Worry: Not on file    Inability: Not on file  . Transportation needs:    Medical: Not on file    Non-medical: Not on file  Tobacco Use  . Smoking status: Never Smoker  . Smokeless tobacco: Never Used  Substance and Sexual Activity  . Alcohol use: Yes    Comment: socially  . Drug use: No  . Sexual activity: Yes    Birth control/protection: None  Lifestyle  . Physical activity:    Days per week: Not on file    Minutes per session: Not on file  . Stress: Not on file  Relationships  . Social connections:    Talks on phone: Not on file    Gets together: Not on file    Attends religious service: Not on file    Active member of club or organization: Not on file    Attends meetings of clubs or organizations: Not on file    Relationship status: Not on file  . Intimate  partner violence:    Fear of current or ex partner: Not on file    Emotionally abused: Not on file    Physically abused: Not on file    Forced sexual activity: Not on file  Other Topics Concern  . Not on file  Social History Narrative   Lives at home with her boyfriend   Right handed   Caffeine: none     Family History  Problem Relation Age of Onset  . Heart disease Father   . Heart disease Mother   . Headache Neg Hx     Past Medical History:  Diagnosis Date  . HELLP syndrome    G4    Past Surgical History:  Procedure Laterality Date  . DILATION AND CURETTAGE OF UTERUS    .  INDUCED ABORTION      Current Outpatient Medications  Medication Sig Dispense Refill  . acetaZOLAMIDE (DIAMOX) 250 MG tablet Take one pill in the morning and 2 pills at night. 90 tablet 6  . norgestimate-ethinyl estradiol (SPRINTEC 28) 0.25-35 MG-MCG tablet Take 1 tablet by mouth daily.     No current facility-administered medications for this visit.     Allergies as of 04/26/2018  . (No Known Allergies)    Vitals: BP (!) 141/93 (BP Location: Right Arm, Patient Position: Sitting)   Pulse 87   Ht  (1.575 m)   Wt 164 lb (74.4 kg)   BMI 30.00 kg/m  Last Weight:  Wt Readings from Last 1 Encounters:  04/26/18 164 lb (74.4 kg)   Last Height:   Ht Readings from Last 1 Encounters:  04/26/18  (1.575 m)    Physical exam: Exam: Gen: NAD, conversant, well nourised, obese, well groomed                     CV: RRR, no MRG. No Carotid Bruits. No peripheral edema, warm, nontender Eyes: Conjunctivae clear without exudates or hemorrhage  Neuro: Detailed Neurologic Exam  Speech:    Speech is normal; fluent and spontaneous with normal comprehension.  Cognition:    The patient is oriented to person, place, and time;     recent and remote memory intact;     language fluent;     normal attention, concentration,     fund of knowledge Cranial Nerves:    The pupils are equal, round, and reactive to light. +1 papilledema.  Visual fields are full to finger confrontation. Extraocular movements are intact. Trigeminal sensation is intact and the muscles of mastication are normal. The face is symmetric. The palate elevates in the midline. Hearing intact. Voice is normal. Shoulder shrug is normal. The tongue has normal motion without fasciculations.   Coordination:    Normal finger to nose and heel to shin. Normal rapid alternating movements.   Gait:    Heel-toe and tandem gait are normal.   Motor Observation:    No asymmetry, no atrophy, and no involuntary movements  noted. Tone:    Normal muscle tone.    Posture:    Posture is normal. normal erect    Strength:    Strength is V/V in the upper and lower limbs.      Sensation: intact to LT     Reflex Exam:  DTR's:    Deep tendon reflexes in the upper and lower extremities are normal bilaterally.   Toes:    The toes are downgoing bilaterally.   Clonus:    Clonus is absent.  Assessment/Plan:  32 year old with bilateral optic nerve head edema. Opening pressure 39, partially empty sella on MRI. She is much improved on the Diamox, only one headache since starting it. Will increase Diamox and she can follow with Dr. Sherryll Burger.   Also discussed intracranial hypertension, idiopathic, associated weight, risk of permanent vision loss, weight loss.  The patient has acute changes she needs to proceed to emergency room immediately.  Follows with Dr. Sherryll Burger on the 10th of June  Discussed: To prevent or relieve headaches, try the following: Cool Compress. Lie down and place a cool compress on your head.  Avoid headache triggers. If certain foods or odors seem to have triggered your migraines in the past, avoid them. A headache diary might help you identify triggers.  Include physical activity in your daily routine. Try a daily walk or other moderate aerobic exercise.  Manage stress. Find healthy ways to cope with the stressors, such as delegating tasks on your to-do list.  Practice relaxation techniques. Try deep breathing, yoga, massage and visualization.  Eat regularly. Eating regularly scheduled meals and maintaining a healthy diet might help prevent headaches. Also, drink plenty of fluids.  Follow a regular sleep schedule. Sleep deprivation might contribute to headaches Consider biofeedback. With this mind-body technique, you learn to control certain bodily functions - such as muscle tension, heart rate and blood pressure - to prevent headaches or reduce headache pain.    Proceed to emergency room if you  experience new or worsening symptoms or symptoms do not resolve, if you have new neurologic symptoms or if headache is severe, or for any concerning symptom.   Naomie Dean, MD  [facility] [address] [place], [place] [address]  Phone [phone] Fax [phone]  A total of 25 minutes was spent face-to-face with this patient. Over half this time was spent on counseling patient on the IIH diagnosis and different therapeutic options, risks ans benefits of management, compliance, or risk factor reduction and education.

## 2018-05-17 ENCOUNTER — Ambulatory Visit: Payer: Medicaid Other | Admitting: Neurology

## 2018-07-30 ENCOUNTER — Encounter: Payer: Self-pay | Admitting: Neurology

## 2018-08-02 ENCOUNTER — Ambulatory Visit: Payer: Medicaid Other | Admitting: Neurology

## 2018-09-22 ENCOUNTER — Ambulatory Visit: Payer: Medicaid Other | Admitting: Neurology

## 2018-09-22 ENCOUNTER — Telehealth: Payer: Self-pay | Admitting: *Deleted

## 2018-09-22 NOTE — Telephone Encounter (Signed)
Pt no showed f/u appt on 09/22/2018 @ 08:30.

## 2018-09-23 ENCOUNTER — Encounter: Payer: Self-pay | Admitting: Neurology

## 2018-11-10 ENCOUNTER — Ambulatory Visit: Payer: Medicaid Other | Admitting: Neurology

## 2018-11-10 ENCOUNTER — Telehealth: Payer: Self-pay | Admitting: *Deleted

## 2018-11-10 NOTE — Telephone Encounter (Signed)
Pt no showed her f/u appt on 11/10/2018 @ 08:30. This is her 2nd no show.

## 2020-11-14 ENCOUNTER — Encounter (HOSPITAL_COMMUNITY): Payer: Self-pay | Admitting: Emergency Medicine

## 2020-11-14 ENCOUNTER — Other Ambulatory Visit: Payer: Self-pay

## 2020-11-14 ENCOUNTER — Ambulatory Visit (HOSPITAL_COMMUNITY)
Admission: EM | Admit: 2020-11-14 | Discharge: 2020-11-14 | Disposition: A | Discharge status: Home / Self Care | Payer: Medicaid Other | Attending: Urgent Care | Admitting: Urgent Care

## 2020-11-14 DIAGNOSIS — J019 Acute sinusitis, unspecified: Secondary | ICD-10-CM

## 2020-11-14 MED ORDER — BENZONATATE 100 MG PO CAPS: 100.0000 mg | ORAL_CAPSULE | Freq: Three times a day (TID) | ORAL | 0 refills | Status: DC | PRN

## 2020-11-14 MED ORDER — PSEUDOEPHEDRINE HCL 60 MG PO TABS: 60.0000 mg | ORAL_TABLET | Freq: Three times a day (TID) | ORAL | 0 refills | Status: DC | PRN

## 2020-11-14 MED ORDER — PROMETHAZINE-DM 6.25-15 MG/5ML PO SYRP: 5.0000 mL | ORAL_SOLUTION | Freq: Every evening | ORAL | 0 refills | Status: DC | PRN

## 2020-11-14 MED ORDER — CETIRIZINE HCL 10 MG PO TABS: 10.0000 mg | ORAL_TABLET | Freq: Every day | ORAL | 0 refills | Status: DC

## 2020-11-14 MED ORDER — AMOXICILLIN 875 MG PO TABS: 875.0000 mg | ORAL_TABLET | Freq: Two times a day (BID) | ORAL | 0 refills | Status: DC

## 2020-11-14 NOTE — ED Provider Notes (Signed)
[name]   MRN: [id number] DOB: Jul 08, 1986  Subjective:   Amy [name] is a 34 y.o. female presenting for 10 day hx of persistent sinus congestion/pressure, hacking cough. She had a COVID 19 test ~1 week ago and was negative. Has not worsened but is not improving at all. Denies smoking, hx of asthma, allergies.   No current facility-administered medications for this encounter.  Current Outpatient Medications:  .  acetaZOLAMIDE (DIAMOX) 250 MG tablet, Take one pill in the morning and 2 pills at night., Disp: 90 tablet, Rfl: 6 .  norgestimate-ethinyl estradiol (SPRINTEC 28) 0.25-35 MG-MCG tablet, Take 1 tablet by mouth daily., Disp: , Rfl:    No Known Allergies  Past Medical History:  Diagnosis Date  . HELLP syndrome    G4     Past Surgical History:  Procedure Laterality Date  . DILATION AND CURETTAGE OF UTERUS    . INDUCED ABORTION      Family History  Problem Relation Age of Onset  . Heart disease Father   . Heart disease Mother   . Headache Neg Hx     Social History   Tobacco Use  . Smoking status: Never Smoker  . Smokeless tobacco: Never Used  Vaping Use  . Vaping Use: Never used  Substance Use Topics  . Alcohol use: Yes    Comment: socially  . Drug use: No    ROS   Objective:   Vitals: BP 138/82 (BP Location: Right Arm)   Pulse (!) 103   Temp 97.8 F (36.6 C) (Temporal)   Resp 18   LMP 11/07/2020   SpO2 97%   Physical Exam Constitutional:      General: She is not in acute distress.    Appearance: Normal appearance. She is well-developed. She is not ill-appearing, toxic-appearing or diaphoretic.  HENT:     Head: Normocephalic and atraumatic.     Nose: Nose normal.     Mouth/Throat:     Mouth: Mucous membranes are moist.  Eyes:     Extraocular Movements: Extraocular movements intact.     Pupils: Pupils are equal, round, and reactive to light.  Cardiovascular:     Rate and Rhythm: Normal rate and regular rhythm.      Pulses: Normal pulses.     Heart sounds: Normal heart sounds. No murmur heard.  No friction rub. No gallop.   Pulmonary:     Effort: Pulmonary effort is normal. No respiratory distress.     Breath sounds: Normal breath sounds. No stridor. No wheezing, rhonchi or rales.  Skin:    General: Skin is warm and dry.     Findings: No rash.  Neurological:     Mental Status: She is alert and oriented to person, place, and time.  Psychiatric:        Mood and Affect: Mood normal.        Behavior: Behavior normal.        Thought Content: Thought content normal.      Assessment and Plan :   PDMP not reviewed this encounter.  1. Acute non-recurrent sinusitis, unspecified location     Will start empiric treatment for sinusitis with amoxicillin.  Recommended supportive care otherwise including the use of oral antihistamine, decongestant. Deferred COVID testing as she already had this done and was negative. Counseled patient on potential for adverse effects with medications prescribed/recommended today, ER and return-to-clinic precautions discussed, patient verbalized understanding.    Wallis Bamberg, PA-C 11/14/20 1054

## 2020-11-14 NOTE — ED Triage Notes (Addendum)
Patient presents to [facility] for assessment of cough, nasal congestion since 11/29.  States she had a COVID test and it was negative.  States symptoms are not resolving, but not worsening.  Pt c/o diarrhea starting 2 days ago, described it as liquid

## 2022-03-02 ENCOUNTER — Telehealth: Payer: Self-pay | Admitting: Nurse Practitioner

## 2022-03-02 DIAGNOSIS — R519 Headache, unspecified: Secondary | ICD-10-CM

## 2022-03-02 DIAGNOSIS — G932 Benign intracranial hypertension: Secondary | ICD-10-CM

## 2022-03-02 DIAGNOSIS — H4710 Unspecified papilledema: Secondary | ICD-10-CM

## 2022-03-02 MED ORDER — ACETAZOLAMIDE 250 MG PO TABS: ORAL_TABLET | ORAL | 0 refills | Status: DC

## 2022-03-02 NOTE — Patient Instructions (Signed)
?  Amy [name], thank you for joining Claiborne Rigg, NP for today's virtual visit.  While this provider is not your primary care provider (PCP), if your PCP is located in our provider database this encounter information will be shared with them immediately following your visit. ? ?Consent: ?(Patient) [name] provided verbal consent for this virtual visit at the beginning of the encounter. ? ?Current Medications: ? ?Current Outpatient Medications:  ?  acetaZOLAMIDE (DIAMOX) 250 MG tablet, Take one pill in the morning and 2 pills at night., Disp: 90 tablet, Rfl: 0 ?  amoxicillin (AMOXIL) 875 MG tablet, Take 1 tablet (875 mg total) by mouth 2 (two) times daily., Disp: 14 tablet, Rfl: 0 ?  benzonatate (TESSALON) 100 MG capsule, Take 1-2 capsules (100-200 mg total) by mouth 3 (three) times daily as needed., Disp: 60 capsule, Rfl: 0 ?  cetirizine (ZYRTEC ALLERGY) 10 MG tablet, Take 1 tablet (10 mg total) by mouth daily., Disp: 30 tablet, Rfl: 0 ?  norgestimate-ethinyl estradiol (SPRINTEC 28) 0.25-35 MG-MCG tablet, Take 1 tablet by mouth daily., Disp: , Rfl:  ?  promethazine-dextromethorphan (PROMETHAZINE-DM) 6.25-15 MG/5ML syrup, Take 5 mLs by mouth at bedtime as needed for cough., Disp: 100 mL, Rfl: 0 ?  pseudoephedrine (SUDAFED) 60 MG tablet, Take 1 tablet (60 mg total) by mouth every 8 (eight) hours as needed for congestion., Disp: 30 tablet, Rfl: 0  ? ?Medications ordered in this encounter:  ?Meds ordered this encounter  ?Medications  ? acetaZOLAMIDE (DIAMOX) 250 MG tablet  ?  Sig: Take one pill in the morning and 2 pills at night.  ?  Dispense:  90 tablet  ?  Refill:  0  ?  Order Specific Question:   Supervising Provider  ?  Answer:   Eber Hong [[id number]]  ?  ? ?*If you need refills on other medications prior to your next appointment, please contact your pharmacy* ? ?Follow-Up: ?Call back or seek an in-person evaluation if the symptoms worsen or if the condition fails to improve as  anticipated. ? ?Other Instructions ?INSTRUCTED TO OBTAIN BP MONITOR AND IF BP >150/100 needs to go to emergency room. ? ?If you have been instructed to have an in-person evaluation today at a local Urgent Care facility, please use the link below. It will take you to a list of all of our available [facility], including address, phone number and hours of operation. Please do not delay care.  ?[facility] ? ?If you or a family member do not have a primary care provider, use the link below to schedule a visit and establish care. When you choose a [facility] primary care physician or advanced practice provider, you gain a long-term partner in health. ?Find a Primary Care Provider ? ?Learn more about [facility]'s in-office and virtual care options: ?[facility] - Get Care Now  ?

## 2022-03-02 NOTE — Progress Notes (Signed)
?Virtual Visit Consent  ? ?Amy [name], you are scheduled for a virtual visit with a Amy [name] today.   ?  ?Just as with appointments in the office, your consent must be obtained to participate.  Your consent will be active for this visit and any virtual visit you may have with one of our providers in the next 365 days.   ?  ?If you have a MyChart account, a copy of this consent can be sent to you electronically.  All virtual visits are billed to your insurance company just like a traditional visit in the office.   ? ?As this is a virtual visit, video technology does not allow for your [name] to perform a traditional examination.  This may limit your [name]'s ability to fully assess your condition.  If your [name] identifies any concerns that need to be evaluated in person or the need to arrange testing (such as labs, EKG, etc.), we will make arrangements to do so.   ?  ?Although advances in technology are sophisticated, we cannot ensure that it will always work on either your end or our end.  If the connection with a video visit is poor, the visit may have to be switched to a telephone visit.  With either a video or telephone visit, we are not always able to ensure that we have a secure connection.    ? ?I need to obtain your verbal consent now.   Are you willing to proceed with your visit today?  ?  ?Amy [name] has provided verbal consent on 03/02/2022 for a virtual visit (video or telephone). ?  ?Amy Rigg, NP  ? ?Date: 03/02/2022 1:21 PM ? ? ?Virtual Visit via Video Note  ? ?Amy [name], connected with  Amy [name]  ([id number], 09/23/1986) on 03/02/22 at  1:00 PM EDT by a video-enabled telemedicine application and verified that I am speaking with the correct person using two identifiers. ? ?Location: ?Patient: Virtual Visit Location Patient: Home ?[name]: Virtual Visit Location [name]: Home Office ?  ?I discussed the limitations of evaluation and management by  telemedicine and the availability of in person appointments. The patient expressed understanding and agreed to proceed.   ? ?History of Present Illness: ?Amy [name] is a 36 y.o. who identifies as a female who was assigned female at birth, and is being seen today for Headache Intractable. ? ?Notes one week onset of headache and dizziness. She has a history of HTN, Idiopathic intracranial HTN for which she had been taking diamox which was prescribed by Neurology. Due to insurance issues she has not been able to follow up and is currently no longer taking diamox. She does not have a blood pressure monitoring device to check her blood pressure but states she will borrow one from a friend and if blood pressure is high she is aware she needs to be seen in the emergency room. Headaches currently unrelieved with XS tylenol and 800 mg of ibuprofen. On video exam today she does not exhibit any facial asymmetry, dysarthria and denies any acute visual changes, floaters, new onset weakness or hemiparesis,  or chest pain.   ? ?Problems:  ?Patient Active Problem List  ? Diagnosis Date Noted  ? IIH (idiopathic intracranial hypertension) 04/26/2018  ? Chronic intractable headache 03/02/2018  ? Normal delivery 09/02/2014  ? Severe preeclampsia 09/02/2014  ? Pregnancy 09/01/2014  ? Protein-calorie malnutrition, severe (HCC) 09/01/2014  ? Elevated liver enzymes 08/31/2014  ?  ?Allergies: No Known Allergies ?  Medications:  ?Current Outpatient Medications:  ?  acetaZOLAMIDE (DIAMOX) 250 MG tablet, Take one pill in the morning and 2 pills at night., Disp: 90 tablet, Rfl: 0 ?  amoxicillin (AMOXIL) 875 MG tablet, Take 1 tablet (875 mg total) by mouth 2 (two) times daily., Disp: 14 tablet, Rfl: 0 ?  benzonatate (TESSALON) 100 MG capsule, Take 1-2 capsules (100-200 mg total) by mouth 3 (three) times daily as needed., Disp: 60 capsule, Rfl: 0 ?  cetirizine (ZYRTEC ALLERGY) 10 MG tablet, Take 1 tablet (10 mg total) by mouth daily., Disp:  30 tablet, Rfl: 0 ?  norgestimate-ethinyl estradiol (SPRINTEC 28) 0.25-35 MG-MCG tablet, Take 1 tablet by mouth daily., Disp: , Rfl:  ?  promethazine-dextromethorphan (PROMETHAZINE-DM) 6.25-15 MG/5ML syrup, Take 5 mLs by mouth at bedtime as needed for cough., Disp: 100 mL, Rfl: 0 ?  pseudoephedrine (SUDAFED) 60 MG tablet, Take 1 tablet (60 mg total) by mouth every 8 (eight) hours as needed for congestion., Disp: 30 tablet, Rfl: 0 ? ?Observations/Objective: ?Patient is well-developed, well-nourished in no acute distress.  ?Resting comfortably at home in bed.  ?Head is normocephalic, atraumatic.  ?No labored breathing. ?Speech is clear and coherent with logical content.  ?Patient is alert and oriented at baseline.  ? ? ?Assessment and Plan: ?1. Intractable episodic headache, unspecified headache type ?- acetaZOLAMIDE (DIAMOX) 250 MG tablet; Take one pill in the morning and 2 pills at night.  Dispense: 90 tablet; Refill: 0 ? ?2. Papilledema ?- acetaZOLAMIDE (DIAMOX) 250 MG tablet; Take one pill in the morning and 2 pills at night.  Dispense: 90 tablet; Refill: 0 ? ?3. IIH (idiopathic intracranial hypertension) ?- acetaZOLAMIDE (DIAMOX) 250 MG tablet; Take one pill in the morning and 2 pills at night.  Dispense: 90 tablet; Refill: 0 ? ? ?Follow Up Instructions: ?I discussed the assessment and treatment plan with the patient. The patient was provided an opportunity to ask questions and all were answered. The patient agreed with the plan and demonstrated an understanding of the instructions.  A copy of instructions were sent to the patient via MyChart unless otherwise noted below.  ? ?The patient was advised to call back or seek an in-person evaluation if the symptoms worsen or if the condition fails to improve as anticipated. ? ?Time:  ?I spent 12 minutes with the patient via telehealth technology discussing the above problems/concerns.   ? ?Amy Rigg, NP  ?

## 2022-03-03 ENCOUNTER — Encounter (HOSPITAL_COMMUNITY): Payer: Self-pay

## 2022-03-03 ENCOUNTER — Telehealth (HOSPITAL_COMMUNITY): Payer: Self-pay | Admitting: Emergency Medicine

## 2022-03-03 ENCOUNTER — Ambulatory Visit (HOSPITAL_COMMUNITY)
Admission: EM | Admit: 2022-03-03 | Discharge: 2022-03-03 | Disposition: A | Discharge status: Home / Self Care | Payer: Medicaid Other | Attending: Internal Medicine | Admitting: Internal Medicine

## 2022-03-03 DIAGNOSIS — G44201 Tension-type headache, unspecified, intractable: Secondary | ICD-10-CM | POA: Insufficient documentation

## 2022-03-03 DIAGNOSIS — I10 Essential (primary) hypertension: Secondary | ICD-10-CM | POA: Insufficient documentation

## 2022-03-03 LAB — BASIC METABOLIC PANEL
Anion gap: 6 (ref 5–15)
BUN: 6 mg/dL (ref 6–20)
CO2: 22 mmol/L (ref 22–32)
Calcium: 9.3 mg/dL (ref 8.9–10.3)
Chloride: 110 mmol/L (ref 98–111)
Creatinine, Ser: 0.77 mg/dL (ref 0.44–1.00)
GFR, Estimated: >60 mL/min (ref >60)
Glucose, Bld: 107 mg/dL — ABNORMAL HIGH (ref 70–99)
Potassium: 3.5 mmol/L (ref 3.5–5.1)
Sodium: 138 mmol/L (ref 135–145)

## 2022-03-03 LAB — CBC WITH DIFFERENTIAL/PLATELET
Abs Immature Granulocytes: 0.02 10*3/uL (ref 0.00–0.07)
Basophils Absolute: 0.1 10*3/uL (ref 0.0–0.1)
Basophils Relative: 1 %
Eosinophils Absolute: 0.3 10*3/uL (ref 0.0–0.5)
Eosinophils Relative: 4 %
HCT: 31.9 % — ABNORMAL LOW (ref 36.0–46.0)
Hemoglobin: 9.7 g/dL — ABNORMAL LOW (ref 12.0–15.0)
Immature Granulocytes: 0 %
Lymphocytes Relative: 29 %
Lymphs Abs: 2.3 10*3/uL (ref 0.7–4.0)
MCH: 25.5 pg — ABNORMAL LOW (ref 26.0–34.0)
MCHC: 30.4 g/dL (ref 30.0–36.0)
MCV: 83.7 fL (ref 80.0–100.0)
Monocytes Absolute: 0.8 10*3/uL (ref 0.1–1.0)
Monocytes Relative: 11 %
Neutro Abs: 4.3 10*3/uL (ref 1.7–7.7)
Neutrophils Relative %: 55 %
Platelets: 383 10*3/uL (ref 150–400)
RBC: 3.81 MIL/uL — ABNORMAL LOW (ref 3.87–5.11)
RDW: 14.7 % (ref 11.5–15.5)
WBC: 7.8 10*3/uL (ref 4.0–10.5)
nRBC: 0 % (ref 0.0–0.2)

## 2022-03-03 LAB — TSH: TSH: 0.802 u[IU]/mL (ref 0.350–4.500)

## 2022-03-03 LAB — POC URINE PREG, ED: Preg Test, Ur: POSITIVE — AB

## 2022-03-03 MED ORDER — AMLODIPINE BESYLATE 5 MG PO TABS: 5.0000 mg | ORAL_TABLET | Freq: Every day | ORAL | 1 refills | Status: DC

## 2022-03-03 NOTE — ED Provider Notes (Signed)
?[facility] ? ? ? ?CSN: [id number] ?Arrival date & time: 03/03/22  1015 ? ? ?  ? ?History   ?Chief Complaint ?Chief Complaint  ?Patient presents with  ? Headache  ? ? ?HPI ?Amy [name] is a 36 y.o. female with a history of idiopathic intracranial hypertension comes to the urgent care with generalized pounding headache over the past couple weeks.  Patient recently had medical abortion a few weeks ago.  The headache is global, throbbing, no aggravating or relieving factors.  She describes over-the-counter pain medications with no improvement in symptoms.  Headache is currently moderate severity-7 out of 10.  Patient endorses elevated blood pressure with the last blood pressure checked 2 weeks ago.  His blood pressure read about 150/105 mmHg.  She denies any abdominal pain or chest pain.  No chest pressure.  She was recently restarted on acetazolamide for intracranial hypertension.  No fever or chills.  No falls or trauma to the head.  No near syncope or syncopal episode.  No history of migraines.  Patient denies any antihypertensive medication use in the past. ? ?Patient says he has been eating after using abortifacient agent.  She has had some dizziness no syncope or near syncopal episode.  Bleeding subsided a couple of days ago.  Patient is scheduled for follow-up today. ? ?HPI ? ?Past Medical History:  ?Diagnosis Date  ? HELLP syndrome   ? G4  ? ? ?Patient Active Problem List  ? Diagnosis Date Noted  ? IIH (idiopathic intracranial hypertension) 04/26/2018  ? Chronic intractable headache 03/02/2018  ? Normal delivery 09/02/2014  ? Severe preeclampsia 09/02/2014  ? Pregnancy 09/01/2014  ? Protein-calorie malnutrition, severe (HCC) 09/01/2014  ? Elevated liver enzymes 08/31/2014  ? ? ?Past Surgical History:  ?Procedure Laterality Date  ? DILATION AND CURETTAGE OF UTERUS    ? INDUCED ABORTION    ? ? ?OB History   ? ? Gravida  ?5  ? Para  ?1  ? Term  ?   ? Preterm  ?1  ? AB  ?3  ? Living  ?1  ?  ? ? SAB   ?1  ? IAB  ?2  ? Ectopic  ?   ? Multiple  ?   ? Live Births  ?1  ?   ?  ?  ? ? ? ?Home Medications   ? ?Prior to Admission medications   ?Medication Sig Start Date End Date Taking? Authorizing Provider  ?acetaZOLAMIDE (DIAMOX) 250 MG tablet Take one pill in the morning and 2 pills at night. 03/02/22   Claiborne RiggFleming, Zelda W, NP  ?amLODipine (NORVASC) 5 MG tablet Take 1 tablet (5 mg total) by mouth daily. 03/03/22   LampteyBritta Mccreedy, Tareek Sabo O, MD  ?benzonatate (TESSALON) 100 MG capsule Take 1-2 capsules (100-200 mg total) by mouth 3 (three) times daily as needed. 11/14/20   Wallis BambergMani, Mario, PA-C  ?cetirizine (ZYRTEC ALLERGY) 10 MG tablet Take 1 tablet (10 mg total) by mouth daily. 11/14/20   Wallis BambergMani, Mario, PA-C  ?norgestimate-ethinyl estradiol (SPRINTEC 28) 0.25-35 MG-MCG tablet Take 1 tablet by mouth daily.    [provider]  ?promethazine-dextromethorphan (PROMETHAZINE-DM) 6.25-15 MG/5ML syrup Take 5 mLs by mouth at bedtime as needed for cough. 11/14/20   Wallis BambergMani, Mario, PA-C  ?pseudoephedrine (SUDAFED) 60 MG tablet Take 1 tablet (60 mg total) by mouth every 8 (eight) hours as needed for congestion. 11/14/20   Wallis BambergMani, Mario, PA-C  ? ? ?Family History ?Family History  ?Problem Relation Age of Onset  ?  Heart disease Father   ? Heart disease Mother   ? Headache Neg Hx   ? ? ?Social History ?Social History  ? ?Tobacco Use  ? Smoking status: Never  ? Smokeless tobacco: Never  ?Vaping Use  ? Vaping Use: Never used  ?Substance Use Topics  ? Alcohol use: Yes  ?  Comment: socially  ? Drug use: No  ? ? ? ?Allergies   ?Patient has no known allergies. ? ? ?Review of Systems ?Review of Systems ?As per HPI. ? ?Physical Exam ?Triage Vital Signs ?ED Triage Vitals [03/03/22 1103]  ?Enc Vitals Group  ?   BP (!) 157/109  ?   Pulse Rate (!) 107  ?   Resp 16  ?   Temp 98.4 ?F (36.9 ?C)  ?   Temp Source Oral  ?   SpO2 99 %  ?   Weight   ?   Height   ?   Head Circumference   ?   Peak Flow   ?   Pain Score   ?   Pain Loc   ?   Pain Edu?   ?   Excl. in GC?    ? ?No data found. ? ?Updated Vital Signs ?BP (!) 157/109 (BP Location: Left Arm)   Pulse (!) 107   Temp 98.4 ?F (36.9 ?C) (Oral)   Resp 16   LMP  (Approximate)   SpO2 99%  ? ?Visual Acuity ?Right Eye Distance:   ?Left Eye Distance:   ?Bilateral Distance:   ? ?Right Eye Near:   ?Left Eye Near:    ?Bilateral Near:    ? ?Physical Exam ?Vitals and nursing note reviewed.  ?Constitutional:   ?   General: She is not in acute distress. ?   Appearance: She is not ill-appearing.  ?Eyes:  ?   General: No visual field deficit. ?Cardiovascular:  ?   Rate and Rhythm: Normal rate and regular rhythm.  ?   Heart sounds: Normal heart sounds.  ?Pulmonary:  ?   Effort: Pulmonary effort is normal.  ?   Breath sounds: Normal breath sounds.  ?Abdominal:  ?   General: Bowel sounds are normal.  ?   Palpations: Abdomen is soft.  ?Neurological:  ?   Mental Status: She is alert.  ?   GCS: GCS eye subscore is 4. GCS verbal subscore is 5. GCS motor subscore is 6.  ?   Cranial Nerves: No cranial nerve deficit, dysarthria or facial asymmetry.  ?   Sensory: No sensory deficit.  ?   Coordination: Romberg sign negative. Coordination normal.  ? ? ? ?UC Treatments / Results  ?Labs ?(all labs ordered are listed, but only abnormal results are displayed) ?Labs Reviewed  ?BASIC METABOLIC PANEL - Abnormal; Notable for the following components:  ?    Result Value  ? Glucose, Bld 107 (*)   ? All other components within normal limits  ?CBC WITH DIFFERENTIAL/PLATELET - Abnormal; Notable for the following components:  ? RBC 3.81 (*)   ? Hemoglobin 9.7 (*)   ? HCT 31.9 (*)   ? MCH 25.5 (*)   ? All other components within normal limits  ?POC URINE PREG, ED - Abnormal; Notable for the following components:  ? Preg Test, Ur POSITIVE (*)   ? All other components within normal limits  ?TSH  ?POCT URINALYSIS DIPSTICK, ED / UC  ? ? ?EKG ? ? ?Radiology ?No results found. ? ?Procedures ?Procedures (including critical care time) ? ?Medications  Ordered in  UC ?Medications - No data to display ? ?Initial Impression / Assessment and Plan / UC Course  ?I have reviewed the triage vital signs and the nursing notes. ? ?Pertinent labs & imaging results that were available during my care of the patient were reviewed by me and considered in my medical decision making (see chart for details). ? ?  ? ?1.  Essential hypertension, ?BMP ?Start patient on amlodipine 5 mg orally daily ?Tylenol as needed for headache ?Return precautions given ?Weight loss decrease salt intake to 2 g or less in 24 hours ?Increase physical activity ? ?2.  Recent abortion: ?CBC shows hemoglobin of 9.7 ?Patient is advised to follow-up with clinic for further evaluation/management ?Return precautions given ?Final Clinical Impressions(s) / UC Diagnoses  ? ?Final diagnoses:  ?Essential hypertension  ?Intractable tension-type headache, unspecified chronicity pattern  ? ? ? ?Discharge Instructions   ? ?  ?Please take medications as prescribed ?I suspect that your headaches is as a result of sustained elevated blood pressure ?We will call you with recommendations if labs are abnormal ?Return to urgent care if symptoms worsen. ? ? ? ? ?ED Prescriptions   ? ? Medication Sig Dispense Auth. Provider  ? amLODipine (NORVASC) 5 MG tablet Take 1 tablet (5 mg total) by mouth daily. 30 tablet Roux Brandy, Britta Mccreedy, MD  ? ?  ? ?PDMP not reviewed this encounter. ?  ?Merrilee Jansky, MD ?03/04/22 1144 ? ?

## 2022-03-03 NOTE — Discharge Instructions (Addendum)
Please take medications as prescribed ?I suspect that your headaches is as a result of sustained elevated blood pressure ?We will call you with recommendations if labs are abnormal ?Return to urgent care if symptoms worsen. ?

## 2022-03-03 NOTE — ED Triage Notes (Signed)
Pt stated she had abortion 2 weeks ago after putting 4 tablet into her vagina to dissolve over night.  She c/o headaches and elevated blood pressure.  ?

## 2022-03-03 NOTE — ED Triage Notes (Signed)
Follow up appt is today at 1:00pm ?

## 2022-05-22 ENCOUNTER — Other Ambulatory Visit: Payer: Self-pay

## 2022-05-22 ENCOUNTER — Observation Stay (HOSPITAL_COMMUNITY)
Admission: EM | Admit: 2022-05-22 | Discharge: 2022-05-24 | Disposition: A | Discharge status: Home / Self Care | Payer: Self-pay | Attending: Internal Medicine | Admitting: Internal Medicine

## 2022-05-22 ENCOUNTER — Encounter (HOSPITAL_COMMUNITY): Payer: Self-pay

## 2022-05-22 DIAGNOSIS — G932 Benign intracranial hypertension: Principal | ICD-10-CM | POA: Insufficient documentation

## 2022-05-22 DIAGNOSIS — H543 Unqualified visual loss, both eyes: Secondary | ICD-10-CM | POA: Insufficient documentation

## 2022-05-22 DIAGNOSIS — Z79899 Other long term (current) drug therapy: Secondary | ICD-10-CM | POA: Insufficient documentation

## 2022-05-22 DIAGNOSIS — H538 Other visual disturbances: Principal | ICD-10-CM

## 2022-05-22 DIAGNOSIS — E876 Hypokalemia: Secondary | ICD-10-CM | POA: Insufficient documentation

## 2022-05-22 DIAGNOSIS — H4710 Unspecified papilledema: Secondary | ICD-10-CM | POA: Insufficient documentation

## 2022-05-22 DIAGNOSIS — I10 Essential (primary) hypertension: Secondary | ICD-10-CM | POA: Insufficient documentation

## 2022-05-22 HISTORY — DX: Benign intracranial hypertension: G93.2

## 2022-05-22 HISTORY — DX: Essential (primary) hypertension: I10

## 2022-05-22 LAB — CBC WITH DIFFERENTIAL/PLATELET
Abs Immature Granulocytes: 0.02 10*3/uL (ref 0.00–0.07)
Basophils Absolute: 0.1 10*3/uL (ref 0.0–0.1)
Basophils Relative: 1 %
Eosinophils Absolute: 0.2 10*3/uL (ref 0.0–0.5)
Eosinophils Relative: 4 %
HCT: 31.5 % — ABNORMAL LOW (ref 36.0–46.0)
Hemoglobin: 9.1 g/dL — ABNORMAL LOW (ref 12.0–15.0)
Immature Granulocytes: 0 %
Lymphocytes Relative: 34 %
Lymphs Abs: 2 10*3/uL (ref 0.7–4.0)
MCH: 22 pg — ABNORMAL LOW (ref 26.0–34.0)
MCHC: 28.9 g/dL — ABNORMAL LOW (ref 30.0–36.0)
MCV: 76.3 fL — ABNORMAL LOW (ref 80.0–100.0)
Monocytes Absolute: 0.8 10*3/uL (ref 0.1–1.0)
Monocytes Relative: 14 %
Neutro Abs: 2.7 10*3/uL (ref 1.7–7.7)
Neutrophils Relative %: 47 %
Platelets: 370 10*3/uL (ref 150–400)
RBC: 4.13 MIL/uL (ref 3.87–5.11)
RDW: 16.3 % — ABNORMAL HIGH (ref 11.5–15.5)
WBC: 5.8 10*3/uL (ref 4.0–10.5)
nRBC: 0 % (ref 0.0–0.2)

## 2022-05-22 LAB — I-STAT BETA HCG BLOOD, ED (MC, WL, AP ONLY): I-stat hCG, quantitative: <5 m[IU]/mL (ref <5)

## 2022-05-22 LAB — COMPREHENSIVE METABOLIC PANEL
ALT: 19 U/L (ref 0–44)
AST: 23 U/L (ref 15–41)
Albumin: 3.8 g/dL (ref 3.5–5.0)
Alkaline Phosphatase: 69 U/L (ref 38–126)
Anion gap: 10 (ref 5–15)
BUN: 5 mg/dL — ABNORMAL LOW (ref 6–20)
CO2: 25 mmol/L (ref 22–32)
Calcium: 8.8 mg/dL — ABNORMAL LOW (ref 8.9–10.3)
Chloride: 104 mmol/L (ref 98–111)
Creatinine, Ser: 0.73 mg/dL (ref 0.44–1.00)
GFR, Estimated: >60 mL/min (ref >60)
Glucose, Bld: 96 mg/dL (ref 70–99)
Potassium: 3.3 mmol/L — ABNORMAL LOW (ref 3.5–5.1)
Sodium: 139 mmol/L (ref 135–145)
Total Bilirubin: 0.4 mg/dL (ref 0.3–1.2)
Total Protein: 7.4 g/dL (ref 6.5–8.1)

## 2022-05-22 NOTE — ED Provider Triage Note (Signed)
Emergency Medicine Provider Triage Evaluation Note  Amy [name] , a 36 y.o. female  was evaluated in triage.  Pt complains of vision loss.  Pt sent here by Dr. Dione Booze.  Symptoms started 3 months ago  Review of Systems  Positive:  Negative:   Physical Exam  BP (!) 185/98 (BP Location: Right Arm)   Pulse 79   Temp 99 F (37.2 C) (Oral)   Resp 14   Ht 5\' 2"  (1.575 m)   Wt 70.3 kg   SpO2 100%   BMI 28.35 kg/m  Gen:   Awake, no distress   Resp:  Normal effort  MSK:   Moves extremities without difficulty  Other:    Medical Decision Making  Medically screening exam initiated at 5:25 PM.  Appropriate orders placed.  Adline Penna was informed that the remainder of the evaluation will be completed by another provider, this initial triage assessment does not replace that evaluation, and the importance of remaining in the ED until their evaluation is complete.     , Elson Areas 05/22/22 1726

## 2022-05-22 NOTE — ED Triage Notes (Signed)
Pt reports loss of vision to her right eye and cloudy vision to left eye "I can only see centrally," onset 3 months ago. She saw a specialist today who told her to come to the ER today to see a neurohospitalist and is worried about complete loss of vision. Denies any other symptoms.

## 2022-05-23 ENCOUNTER — Inpatient Hospital Stay (HOSPITAL_COMMUNITY): Payer: Self-pay

## 2022-05-23 ENCOUNTER — Emergency Department (HOSPITAL_COMMUNITY): Payer: Self-pay

## 2022-05-23 ENCOUNTER — Encounter (HOSPITAL_COMMUNITY): Payer: Self-pay | Admitting: Internal Medicine

## 2022-05-23 DIAGNOSIS — H543 Unqualified visual loss, both eyes: Secondary | ICD-10-CM | POA: Diagnosis present

## 2022-05-23 DIAGNOSIS — G932 Benign intracranial hypertension: Secondary | ICD-10-CM

## 2022-05-23 DIAGNOSIS — I10 Essential (primary) hypertension: Secondary | ICD-10-CM | POA: Diagnosis present

## 2022-05-23 DIAGNOSIS — H4710 Unspecified papilledema: Secondary | ICD-10-CM

## 2022-05-23 LAB — CSF CELL COUNT WITH DIFFERENTIAL
RBC Count, CSF: 58 /mm3 — ABNORMAL HIGH
Tube #: 3
WBC, CSF: 1 /mm3 (ref 0–5)

## 2022-05-23 LAB — HIV ANTIBODY (ROUTINE TESTING W REFLEX): HIV Screen 4th Generation wRfx: NONREACTIVE

## 2022-05-23 LAB — PROTEIN AND GLUCOSE, CSF
Glucose, CSF: 76 mg/dL — ABNORMAL HIGH (ref 40–70)
Total  Protein, CSF: 18 mg/dL (ref 15–45)

## 2022-05-23 MED ORDER — GADOBUTROL 1 MMOL/ML IV SOLN
7.0000 mL | Freq: Once | INTRAVENOUS | Status: AC | PRN
  Administered 2022-05-23: 7 mL via INTRAVENOUS

## 2022-05-23 MED ORDER — POLYETHYLENE GLYCOL 3350 17 G PO PACK: 17.0000 g | PACK | Freq: Every day | ORAL | Status: DC | PRN

## 2022-05-23 MED ORDER — ONDANSETRON HCL 4 MG/2ML IJ SOLN: 4.0000 mg | Freq: Four times a day (QID) | INTRAMUSCULAR | Status: DC | PRN

## 2022-05-23 MED ORDER — HYDRALAZINE HCL 20 MG/ML IJ SOLN: 5.0000 mg | INTRAMUSCULAR | Status: DC | PRN

## 2022-05-23 MED ORDER — ACETAMINOPHEN 325 MG PO TABS
650.0000 mg | ORAL_TABLET | Freq: Four times a day (QID) | ORAL | Status: DC | PRN
  Administered 2022-05-24: 650 mg via ORAL
  Filled 2022-05-23: qty 2

## 2022-05-23 MED ORDER — ACETAZOLAMIDE 250 MG PO TABS
500.0000 mg | ORAL_TABLET | Freq: Two times a day (BID) | ORAL | Status: DC
  Administered 2022-05-23 – 2022-05-24 (×3): 500 mg via ORAL
  Filled 2022-05-23 (×3): qty 2

## 2022-05-23 MED ORDER — MORPHINE SULFATE (PF) 2 MG/ML IV SOLN: 2.0000 mg | INTRAVENOUS | Status: DC | PRN

## 2022-05-23 MED ORDER — ACETAMINOPHEN 650 MG RE SUPP: 650.0000 mg | Freq: Four times a day (QID) | RECTAL | Status: DC | PRN

## 2022-05-23 MED ORDER — LIDOCAINE-EPINEPHRINE (PF) 2 %-1:200000 IJ SOLN
10.0000 mL | Freq: Once | INTRAMUSCULAR | Status: AC
  Administered 2022-05-23: 10 mL via INTRADERMAL
  Filled 2022-05-23: qty 20

## 2022-05-23 MED ORDER — OXYCODONE HCL 5 MG PO TABS: 5.0000 mg | ORAL_TABLET | ORAL | Status: DC | PRN

## 2022-05-23 MED ORDER — AMLODIPINE BESYLATE 5 MG PO TABS
5.0000 mg | ORAL_TABLET | Freq: Every day | ORAL | Status: DC
  Administered 2022-05-23 – 2022-05-24 (×2): 5 mg via ORAL
  Filled 2022-05-23 (×2): qty 1

## 2022-05-23 MED ORDER — BISACODYL 5 MG PO TBEC: 5.0000 mg | DELAYED_RELEASE_TABLET | Freq: Every day | ORAL | Status: DC | PRN

## 2022-05-23 MED ORDER — DOCUSATE SODIUM 100 MG PO CAPS
100.0000 mg | ORAL_CAPSULE | Freq: Two times a day (BID) | ORAL | Status: DC
  Administered 2022-05-23 – 2022-05-24 (×3): 100 mg via ORAL
  Filled 2022-05-23 (×3): qty 1

## 2022-05-23 MED ORDER — ONDANSETRON HCL 4 MG PO TABS: 4.0000 mg | ORAL_TABLET | Freq: Four times a day (QID) | ORAL | Status: DC | PRN

## 2022-05-23 NOTE — Plan of Care (Signed)
Pt. Doing well and in good spirits. Family at bedside. Educated pt on the importance of taking her medications and getting them filled.  Problem: Education: Goal: Knowledge of General Education information will improve Description: Including pain rating scale, medication(s)/side effects and non-pharmacologic comfort measures Outcome: Progressing   Problem: Health Behavior/Discharge Planning: Goal: Ability to manage health-related needs will improve Outcome: Progressing   Problem: Clinical Measurements: Goal: Ability to maintain clinical measurements within normal limits will improve Outcome: Progressing Goal: Will remain free from infection Outcome: Progressing Goal: Diagnostic test results will improve Outcome: Progressing Goal: Respiratory complications will improve Outcome: Progressing Goal: Cardiovascular complication will be avoided Outcome: Progressing   Problem: Activity: Goal: Risk for activity intolerance will decrease Outcome: Progressing   Problem: Nutrition: Goal: Adequate nutrition will be maintained Outcome: Progressing   Problem: Coping: Goal: Level of anxiety will decrease Outcome: Progressing   Problem: Elimination: Goal: Will not experience complications related to bowel motility Outcome: Progressing Goal: Will not experience complications related to urinary retention Outcome: Progressing   Problem: Pain Managment: Goal: General experience of comfort will improve Outcome: Progressing   Problem: Safety: Goal: Ability to remain free from injury will improve Outcome: Progressing   Problem: Skin Integrity: Goal: Risk for impaired skin integrity will decrease Outcome: Progressing

## 2022-05-23 NOTE — Consult Note (Signed)
Reason for Consult: Pseudotumor cerebri with papilledema and visual loss Referring Physician: Jonah Blue, MD  Amy [name] is an 36 y.o. female.  HPI: Patient is a 36 year old right-handed female with a history of pseudotumor cerebri diagnosed in 2019.  She notes that she had a lumbar puncture and was treated with Diamox but then lost to follow-up.  A few months ago she started having increasing headaches and high blood pressure.  She was seen yesterday because of visual problems and Dr. Alden Hipp noted that she had significant papilledema and urgently advised hospitalization.  She has had a lumbar puncture and an MRI of the brain that has been performed.  MRI of the brain demonstrates that she has an empty sella syndrome but is otherwise unremarkable.  4 years ago she had an MRV that demonstrated some asymmetric drainage of the venous system posteriorly with the right being open but showing some turbulent flow in the left side not showing any flow.  Patient is seen now for consideration of treatment of her pseudotumor cerebri.  The patient had an lumbar puncture done this morning and notes that she is feeling considerably better and feels that her vision is better in the left eye though is still blurry in the right eye.  She has been started on Diamox.  Past Medical History:  Diagnosis Date   HELLP syndrome    G4   Hypertension    not taking medications   Pseudotumor cerebri     Past Surgical History:  Procedure Laterality Date   DILATION AND CURETTAGE OF UTERUS     INDUCED ABORTION      Family History  Problem Relation Age of Onset   Heart disease Father    Heart disease Mother    Headache Neg Hx     Social History:  reports that she has never smoked. She has never used smokeless tobacco. She reports current alcohol use. She reports that she does not use drugs.  Allergies: No Known Allergies  Medications: I have reviewed the patient's current medications.  Results for orders  placed or performed during the hospital encounter of 05/22/22 (from the past 48 hour(s))  CBC with Differential     Status: Abnormal   Collection Time: 05/22/22  5:37 PM  Result Value Ref Range   WBC 5.8 4.0 - 10.5 K/uL   RBC 4.13 3.87 - 5.11 MIL/uL   Hemoglobin 9.1 (L) 12.0 - 15.0 g/dL   HCT 08.6 (L) 57.8 - 46.9 %   MCV 76.3 (L) 80.0 - 100.0 fL   MCH 22.0 (L) 26.0 - 34.0 pg   MCHC 28.9 (L) 30.0 - 36.0 g/dL   RDW 62.9 (H) 52.8 - 41.3 %   Platelets 370 150 - 400 K/uL   nRBC 0.0 0.0 - 0.2 %   Neutrophils Relative % 47 %   Neutro Abs 2.7 1.7 - 7.7 K/uL   Lymphocytes Relative 34 %   Lymphs Abs 2.0 0.7 - 4.0 K/uL   Monocytes Relative 14 %   Monocytes Absolute 0.8 0.1 - 1.0 K/uL   Eosinophils Relative 4 %   Eosinophils Absolute 0.2 0.0 - 0.5 K/uL   Basophils Relative 1 %   Basophils Absolute 0.1 0.0 - 0.1 K/uL   Immature Granulocytes 0 %   Abs Immature Granulocytes 0.02 0.00 - 0.07 K/uL    Comment: Performed at [facility], [address], [place], [place] [address]  Comprehensive metabolic panel     Status: Abnormal   Collection  Time: 05/22/22  5:37 PM  Result Value Ref Range   Sodium 139 135 - 145 mmol/L   Potassium 3.3 (L) 3.5 - 5.1 mmol/L   Chloride 104 98 - 111 mmol/L   CO2 25 22 - 32 mmol/L   Glucose, Bld 96 70 - 99 mg/dL    Comment: Glucose reference range applies only to samples taken after fasting for at least 8 hours.   BUN 5 (L) 6 - 20 mg/dL   Creatinine, Ser 6.72 0.44 - 1.00 mg/dL   Calcium 8.8 (L) 8.9 - 10.3 mg/dL   Total Protein 7.4 6.5 - 8.1 g/dL   Albumin 3.8 3.5 - 5.0 g/dL   AST 23 15 - 41 U/L   ALT 19 0 - 44 U/L   Alkaline Phosphatase 69 38 - 126 U/L   Total Bilirubin 0.4 0.3 - 1.2 mg/dL   GFR, Estimated >09 >47 mL/min    Comment: (NOTE) Calculated using the CKD-EPI Creatinine Equation (2021)    Anion gap 10 5 - 15    Comment: Performed at [facility], [address], [place], [place] [address]  I-Stat Beta hCG blood, ED ([facility], [facility], [facility] only)      Status: None   Collection Time: 05/22/22  6:09 PM  Result Value Ref Range   I-stat hCG, quantitative <5.0 <5 mIU/mL   Comment 3            Comment:   GEST. AGE      CONC.  (mIU/mL)   <=1 WEEK        5 - 50     2 WEEKS       50 - 500     3 WEEKS       100 - 10,000     4 WEEKS     1,000 - 30,000        FEMALE AND NON-PREGNANT FEMALE:     LESS THAN 5 mIU/mL   CSF cell count with differential     Status: Abnormal   Collection Time: 05/23/22  5:52 AM  Result Value Ref Range   Tube # 3    Color, CSF COLORLESS COLORLESS   Appearance, CSF CLEAR CLEAR   Supernatant NOT INDICATED    RBC Count, CSF 58 (H) 0 /cu mm   WBC, CSF 1 0 - 5 /cu mm   Other Cells, CSF TOO FEW TO COUNT, SMEAR AVAILABLE FOR REVIEW     Comment: RARE LYMPHOCYTE Performed at [facility], [address], [place], [place] [address]   Protein and glucose, CSF     Status: Abnormal   Collection Time: 05/23/22  5:52 AM  Result Value Ref Range   Glucose, CSF 76 (H) 40 - 70 mg/dL   Total  Protein, CSF 18 15 - 45 mg/dL    Comment: Performed at [facility], [address] [place], [place] [address]  HIV Antibody (routine testing w rflx)     Status: None   Collection Time: 05/23/22  2:21 PM  Result Value Ref Range   HIV Screen 4th Generation wRfx Non Reactive Non Reactive    Comment: Performed at [facility], [address] [place], [place] [address]    MR BRAIN WO CONTRAST  Result Date: 05/23/2022 CLINICAL DATA:  Initial evaluation for acute headache, papilledema. EXAM: MRI HEAD WITHOUT CONTRAST TECHNIQUE: Multiplanar, multiecho pulse sequences of the brain and surrounding structures were obtained without intravenous contrast. COMPARISON:  Prior  MRI from 03/15/2018. FINDINGS: Brain: Cerebral volume within normal limits. No focal parenchymal signal abnormality. No evidence for acute or subacute infarct. Gray-white matter differentiation well maintained. No areas of chronic cortical infarction or other  insult. No acute or chronic intracranial blood products. No mass lesion, midline shift or mass effect. No hydrocephalus or extra-axial fluid collection. Empty sella noted. Vascular: Major intracranial vascular flow voids are maintained. Skull and upper cervical spine: Craniocervical junction within normal limits. Visualized upper cervical spine normal. Bone marrow signal intensity diffusely decreased on T1 weighted sequence, nonspecific, but most commonly related to anemia, smoking or obesity. No focal marrow replacing lesion. Scalp soft tissues within normal limits. Sinuses/Orbits: Globes and orbital soft tissues within normal limits. Few scattered small retention cyst noted about the paranasal sinuses. No mastoid effusion. Other: None. IMPRESSION: 1. Empty sella. Given the patient history of headache, possible idiopathic intracranial hypertension (pseudotumor cerebri) could be considered. Correlation with LP with CSF opening pressure is suggested as clinically warranted. 2. Otherwise normal brain MRI. No other acute intracranial abnormality. Electronically Signed   By: Rise Mu M.D.   On: 05/23/2022 04:01    Review of Systems  Neurological:        Visual loss is noted with chronic headache.  All other systems reviewed and are negative.  Blood pressure (!) 159/91, pulse 68, temperature 98.7 F (37.1 C), temperature source Oral, resp. rate 16, height 5\' 2"  (1.575 m), weight 70.3 kg, SpO2 100 %, unknown if currently breastfeeding. Physical Exam Constitutional:      Appearance: Normal appearance.     Comments: Mildly overweight  HENT:     Head: Normocephalic and atraumatic.     Nose: Nose normal.     Mouth/Throat:     Mouth: Mucous membranes are moist.  Eyes:     Extraocular Movements: Extraocular movements intact.     Conjunctiva/sclera: Conjunctivae normal.     Pupils: Pupils are equal, round, and reactive to light.  Cardiovascular:     Pulses: Normal pulses.     Heart sounds:  Normal heart sounds.  Pulmonary:     Effort: Pulmonary effort is normal.     Breath sounds: Normal breath sounds.  Abdominal:     General: Abdomen is flat.     Palpations: Abdomen is soft.  Musculoskeletal:     Cervical back: Normal range of motion and neck supple.  Skin:    General: Skin is warm and dry.  Neurological:     Mental Status: She is alert.     Comments: Visual fields significantly narrowed bilaterally to finger counting worse on the right than on the left cranial nerves otherwise intact.  Psychiatric:        Mood and Affect: Mood normal.        Behavior: Behavior normal.        Thought Content: Thought content normal.        Judgment: Judgment normal.     Assessment/Plan: Pseudotumor cerebri.  Currently treated with lumbar puncture and on Diamox.  Vision appears to be improving.  Had discussion with the patient regarding surgical treatment of this process.  I noted that in light of the fact that she is feeling some relief with the medical treatment being continue this for a while and she may respond to further medical therapy I am not planning any urgent surgical intervention but I did discuss with her lumboperitoneal shunting versus ventriculoperitoneal shunting I also noted that she might be a candidate for optic canal  fenestrations which is done at [facility] by the ophthalmology department.  Shary Key Khing Belcher 05/23/2022, 5:52 PM

## 2022-05-23 NOTE — ED Notes (Signed)
Pt taken to MRI  

## 2022-05-23 NOTE — Consult Note (Signed)
NEURO HOSPITALIST CONSULT NOTE   Requestig physician: Dr. Adela Lank  Reason for Consult: Worsening vision in the context of known pseudotumor cerebri  History obtained from:  Patient and Chart     HPI:                                                                                                                                          Amy [name] is a 36 y.o. female with known pseudotumor cerebri who presents from Dr. Laruth Bouchard office (Ophthalmology) today after she was sent to the ED for evaluation of her known pseudotumor cerebri, which has begun to manifest with progressive vision loss.   The patient saw Dr. Dione Booze today as a new patient visit for blurred vision in her right eye that have been worsening for the last 2 3 months.  She stated that her vision was foggy and seemed very dim.  She stated that if she is driving and closes her left eye the only thing she can see is her steering wheel or whatever is right in front of her.  She stated that she can see a little bit out of her peripheral vision of her right eye but nothing in her central vision.  She did get new glasses last week but the glasses did not seem to help.  She was seen in urgent care for shortness of breath and headaches about 2 months ago and was treated for high blood pressure which is controlled currently.  Her vision was starting to get blurry in her right eye at that time which she told the Urgent Care doctors but that symptom was not addressed. Her vision gradually worsened until about 3 weeks ago when it got really bad.  She does have symptoms of occasional swishing noise (pulsatile tinnitus) in her ears as well as some tingling in her arm.  She denied transient obscurations of vision prior to onset of the above vision loss symptoms.    She has a history of preeclampsia with 2 prior pregnancies.  She was recently pregnant but this was terminated at 7 weeks which is a about the same time that her vision  symptoms started.  She has seen Dr. Lucia Gaskins in the past for pseudotumor cerebri.    Exam by Dr. Dione Booze revealed bilateral papilledema which on optical coherence tomography revealed severe papilledema bilaterally worse on the right. Goldmann perimetry at Dr. Laruth Bouchard office also revealed severe loss of peripheral vision and central vision in her right eye as well as severe loss of vision in 2 quadrants of her left eye (see paper chart for detailed note from Dr. Laruth Bouchard office).  Dr. Laruth Bouchard impression and plan describes papilledema bilaterally with almost complete loss of vision in her right eye and all but about 10 to 20  degrees of preserved vision centrally in the left eye.  He sent her to the emergency room for evaluation by neurology and neurosurgery consult.  He felt that this was a very urgent situation and that if not treated immediately she will likely lose all remaining vision.  He recommended neurosurgery consult for shunting procedure.  Of note, the patient states that she has been off of her Diamox since 2019 for a reason that she does not recall.  She states that she has not seen Dr. Lucia Gaskins since about 2019.  Past Medical History:  Diagnosis Date   HELLP syndrome    G4  Pseudotumor cerebri  Past Surgical History:  Procedure Laterality Date   DILATION AND CURETTAGE OF UTERUS     INDUCED ABORTION      Family History  Problem Relation Age of Onset   Heart disease Father    Heart disease Mother    Headache Neg Hx              Social History:  reports that she has never smoked. She has never used smokeless tobacco. She reports current alcohol use. She reports that she does not use drugs.  No Known Allergies  MEDICATIONS:                                                                                                                     No current facility-administered medications on file prior to encounter.   Current Outpatient Medications on File Prior to Encounter  Medication  Sig Dispense Refill   acetaZOLAMIDE (DIAMOX) 250 MG tablet Take one pill in the morning and 2 pills at night. 90 tablet 0   amLODipine (NORVASC) 5 MG tablet Take 1 tablet (5 mg total) by mouth daily. 30 tablet 1   benzonatate (TESSALON) 100 MG capsule Take 1-2 capsules (100-200 mg total) by mouth 3 (three) times daily as needed. 60 capsule 0   cetirizine (ZYRTEC ALLERGY) 10 MG tablet Take 1 tablet (10 mg total) by mouth daily. 30 tablet 0   norgestimate-ethinyl estradiol (SPRINTEC 28) 0.25-35 MG-MCG tablet Take 1 tablet by mouth daily.     promethazine-dextromethorphan (PROMETHAZINE-DM) 6.25-15 MG/5ML syrup Take 5 mLs by mouth at bedtime as needed for cough. 100 mL 0   pseudoephedrine (SUDAFED) 60 MG tablet Take 1 tablet (60 mg total) by mouth every 8 (eight) hours as needed for congestion. 30 tablet 0  NOTE: She states that she has NOT been taking her Diamox since stopping it in 2019    ROS:  As per HPI. Denies any additional symptoms on comprehensive ROS.    Blood pressure (!) 188/105, pulse 77, temperature 97.8 F (36.6 C), temperature source Oral, resp. rate 18, height 5\' 2"  (1.575 m), weight 70.3 kg, SpO2 100 %, unknown if currently breastfeeding.   General Examination:                                                                                                       Physical Exam  HEENT-  [place]/AT    Lungs- Respirations unlabored Extremities- No edema   Neurological Examination Mental Status: Alert, oriented x 5, thought content appropriate.  Speech fluent without evidence of aphasia.  Able to follow all commands without difficulty. Cranial Nerves: II: PERRL Central visual acuity OD with glasses on is worse than 20/800. Central visual acuity OS with glasses on is 20/40. She has constriction of her peripheral visual fields in all 4 quadrants of each  eye. Papilledema seen on funduscopic exam bilaterally.  III,IV, VI: No ptosis. EOMI.  V: Temp sensation equal bilaterally  VII: Smile symmetric VIII: Hearing intact to voice IX,X: No hoarseness XI: Symmetric shoulder shrug XII: Midline tongue extension Motor: BUE 5/5 proximally and distally BLE 5/5 proximally and distally  No pronator drift.  Sensory: Temp and light touch intact throughout, bilaterally. No extinction to DSS.  Deep Tendon Reflexes: 2+ and symmetric throughout Cerebellar: No ataxia with FNF bilaterally  Gait: Deferred    Lab Results: Basic Metabolic Panel: Recent Labs  Lab 05/22/22 1737  NA 139  K 3.3*  CL 104  CO2 25  GLUCOSE 96  BUN 5*  CREATININE 0.73  CALCIUM 8.8*    CBC: Recent Labs  Lab 05/22/22 1737  WBC 5.8  NEUTROABS 2.7  HGB 9.1*  HCT 31.5*  MCV 76.3*  PLT 370    Cardiac Enzymes: No results for input(s): "CKTOTAL", "CKMB", "CKMBINDEX", "TROPONINI" in the last 168 hours.  Lipid Panel: No results for input(s): "CHOL", "TRIG", "HDL", "CHOLHDL", "VLDL", "LDLCALC" in the last 168 hours.  Imaging: No results found.   Assessment: 36 y.o. female with a known history of pseudotumor cerebri, formerly on Diamox but off this medication since 2019, who presented from Dr. 2020 office (Ophthalmology) on Thursday after she was sent to the ED for evaluation by Neurology as well as emergent Neurosurgery assessment for possible shunting, given papilledema and severe vision loss noted on outpatient Ophthalmological examination.  1. Exam reveals severe vision loss and constriction of visual fields bilaterally, in conjunction with papilledema.  2. The patient is not currently complaining of a headache. Last headache was 2 months ago.  3. Dr. Friday detailed clinic note describes papilledema bilaterally with almost complete loss of vision in her right eye and all but about 10 to 20 degrees of preserved vision centrally in the left eye.  He sent her to  the emergency room for evaluation by neurology and neurosurgery consult.  He felt that this was a very urgent situation and that if not treated immediately she will likely lose all remaining vision.  He recommended neurosurgery consult  for shunting procedure.  Recommendations: 1. Neurosurgery consultation for consideration of eyesight-preserving ventriculoperitoneal shunting, which Ophthalmology feels should be managed on an emergent basis.  2. ED team plans to perform an LP for removal of 20 cc CSF or attainment of closing pressure of 18 cm H2O, whichever comes first.  3. Admit to the floor under the Hospitalist service 4. Restart Diamox at 500 mg po BID.  5. MRI brain.     Electronically signed: Dr. Caryl Pina 05/23/2022, 2:59 AM

## 2022-05-23 NOTE — ED Provider Notes (Signed)
[facility] EMERGENCY DEPARTMENT Provider Note   CSN: [id number] Arrival date & time: 05/22/22  1653     History  Chief Complaint  Patient presents with   Blurred Vision    Amy [name] is a 36 y.o. female.  36 yo F with a chief complaint of vision loss.  This is been ongoing and progressive.  Going on for about 3 months now.  She saw the ophthalmologist in the office today and he saw papilledema and was concerned that she may continue to lose her vision and become blind.  He then told her to come to the emergency department for evaluation.  She denies headache denies trauma denies one-sided numbness or weakness denies difficulty speech or swallowing.        Home Medications Prior to Admission medications   Medication Sig Start Date End Date Taking? Authorizing Provider  acetaZOLAMIDE (DIAMOX) 250 MG tablet Take one pill in the morning and 2 pills at night. 03/02/22   Claiborne Rigg, NP  amLODipine (NORVASC) 5 MG tablet Take 1 tablet (5 mg total) by mouth daily. 03/03/22   Lamptey, Britta Mccreedy, MD  benzonatate (TESSALON) 100 MG capsule Take 1-2 capsules (100-200 mg total) by mouth 3 (three) times daily as needed. 11/14/20   Wallis Bamberg, PA-C  cetirizine (ZYRTEC ALLERGY) 10 MG tablet Take 1 tablet (10 mg total) by mouth daily. 11/14/20   Wallis Bamberg, PA-C  norgestimate-ethinyl estradiol (SPRINTEC 28) 0.25-35 MG-MCG tablet Take 1 tablet by mouth daily.    [provider]  promethazine-dextromethorphan (PROMETHAZINE-DM) 6.25-15 MG/5ML syrup Take 5 mLs by mouth at bedtime as needed for cough. 11/14/20   Wallis Bamberg, PA-C  pseudoephedrine (SUDAFED) 60 MG tablet Take 1 tablet (60 mg total) by mouth every 8 (eight) hours as needed for congestion. 11/14/20   Wallis Bamberg, PA-C      Allergies    Patient has no known allergies.    Review of Systems   Review of Systems  Physical Exam Updated Vital Signs BP (!) 144/93   Pulse 95   Temp 97.8 F (36.6 C) (Oral)    Resp 14   Ht 5\' 2"  (1.575 m)   Wt 70.3 kg   SpO2 100%   BMI 28.35 kg/m  Physical Exam Vitals and nursing note reviewed.  Constitutional:      General: She is not in acute distress.    Appearance: She is well-developed. She is not diaphoretic.  HENT:     Head: Normocephalic and atraumatic.  Eyes:     Pupils: Pupils are equal, round, and reactive to light.  Cardiovascular:     Rate and Rhythm: Normal rate and regular rhythm.     Heart sounds: No murmur heard.    No friction rub. No gallop.  Pulmonary:     Effort: Pulmonary effort is normal.     Breath sounds: No wheezing or rales.  Abdominal:     General: There is no distension.     Palpations: Abdomen is soft.     Tenderness: There is no abdominal tenderness.  Musculoskeletal:        General: No tenderness.     Cervical back: Normal range of motion and neck supple.  Skin:    General: Skin is warm and dry.  Neurological:     Mental Status: She is alert and oriented to person, place, and time.     GCS: GCS eye subscore is 4. GCS verbal subscore is 5. GCS motor subscore is  6.     Cranial Nerves: Cranial nerves 2-12 are intact.     Sensory: Sensation is intact.     Motor: Motor function is intact.     Coordination: Coordination is intact.     Gait: Gait is intact.  Psychiatric:        Behavior: Behavior normal.     ED Results / Procedures / Treatments   Labs (all labs ordered are listed, but only abnormal results are displayed) Labs Reviewed  CBC WITH DIFFERENTIAL/PLATELET - Abnormal; Notable for the following components:      Result Value   Hemoglobin 9.1 (*)    HCT 31.5 (*)    MCV 76.3 (*)    MCH 22.0 (*)    MCHC 28.9 (*)    RDW 16.3 (*)    All other components within normal limits  COMPREHENSIVE METABOLIC PANEL - Abnormal; Notable for the following components:   Potassium 3.3 (*)    BUN 5 (*)    Calcium 8.8 (*)    All other components within normal limits  CSF CELL COUNT WITH DIFFERENTIAL  PROTEIN AND  GLUCOSE, CSF  I-STAT BETA HCG BLOOD, ED ([facility], [facility], [facility] ONLY)    EKG None  Radiology MR BRAIN WO CONTRAST  Result Date: 05/23/2022 CLINICAL DATA:  Initial evaluation for acute headache, papilledema. EXAM: MRI HEAD WITHOUT CONTRAST TECHNIQUE: Multiplanar, multiecho pulse sequences of the brain and surrounding structures were obtained without intravenous contrast. COMPARISON:  Prior MRI from 03/15/2018. FINDINGS: Brain: Cerebral volume within normal limits. No focal parenchymal signal abnormality. No evidence for acute or subacute infarct. Gray-white matter differentiation well maintained. No areas of chronic cortical infarction or other insult. No acute or chronic intracranial blood products. No mass lesion, midline shift or mass effect. No hydrocephalus or extra-axial fluid collection. Empty sella noted. Vascular: Major intracranial vascular flow voids are maintained. Skull and upper cervical spine: Craniocervical junction within normal limits. Visualized upper cervical spine normal. Bone marrow signal intensity diffusely decreased on T1 weighted sequence, nonspecific, but most commonly related to anemia, smoking or obesity. No focal marrow replacing lesion. Scalp soft tissues within normal limits. Sinuses/Orbits: Globes and orbital soft tissues within normal limits. Few scattered small retention cyst noted about the paranasal sinuses. No mastoid effusion. Other: None. IMPRESSION: 1. Empty sella. Given the patient history of headache, possible idiopathic intracranial hypertension (pseudotumor cerebri) could be considered. Correlation with LP with CSF opening pressure is suggested as clinically warranted. 2. Otherwise normal brain MRI. No other acute intracranial abnormality. Electronically Signed   By: Rise Mu M.D.   On: 05/23/2022 04:01    Procedures .Lumbar Puncture  Date/Time: 05/23/2022 6:09 AM  Performed by: Melene Plan, DO Authorized by: Melene Plan, DO   Consent:    Consent obtained:   Verbal   Consent given by:  Patient   Risks, benefits, and alternatives were discussed: yes     Risks discussed:  Bleeding, infection, headache, nerve damage, pain and repeat procedure   Alternatives discussed:  No treatment, delayed treatment, alternative treatment and observation Universal protocol:    Procedure explained and questions answered to patient or proxy's satisfaction: yes     Imaging studies available: yes     Immediately prior to procedure a time out was called: yes     Site/side marked: yes     Patient identity confirmed:  Verbally with patient Pre-procedure details:    Procedure purpose:  Diagnostic   Preparation: Patient was prepped and draped in usual sterile fashion  Anesthesia:    Anesthesia method:  Local infiltration   Local anesthetic:  Lidocaine 2% WITH epi Procedure details:    Lumbar space:  L4-L5 interspace   Patient position:  R lateral decubitus   Needle gauge:  20   Needle type:  Spinal needle - Quincke tip   Needle length (in):  3.5   Ultrasound guidance: no     Number of attempts:  3   Opening pressure (cm H2O):  27   Closing pressure (cm H2O):  15   Fluid appearance:  Blood-tinged then clearing   Tubes of fluid:  4   Total volume (ml):  6 Post-procedure details:    Puncture site:  Adhesive bandage applied   Procedure completion:  Tolerated well, no immediate complications     Medications Ordered in ED Medications  lidocaine-EPINEPHrine (XYLOCAINE W/EPI) 2 %-1:200000 (PF) injection 10 mL (10 mLs Intradermal Given by Other 05/23/22 2595)    ED Course/ Medical Decision Making/ A&P                           Medical Decision Making Amount and/or Complexity of Data Reviewed Labs: ordered. Radiology: ordered.  Risk Prescription drug management.   36 yo F with a chief complaints of vision loss.  Worse in the right eye than the left.  Going on for about 3 months now.  She saw the ophthalmologist in the office today and he was concern for  papilledema.  Patient has a history of idiopathic intracranial hypertension.  We will discuss with neurology.  I discussed case with Dr. Otelia Limes, neurology recommended a expedited MRI to evaluate for pseudotumor versus other cause.  Will come and evaluate at bedside.  Dr. Otelia Limes felt case was most consistent with pseudotumor cerebri.  Recommended lumbar puncture and removal of CSF to get her pressure down to about 18.  I overshot my marked beds.  Down to 15 on repeat.  Patient tolerated well.  Plan to admit to medicine.  The patients results and plan were reviewed and discussed.   Any x-rays performed were independently reviewed by myself.   Differential diagnosis were considered with the presenting HPI.  Medications  lidocaine-EPINEPHrine (XYLOCAINE W/EPI) 2 %-1:200000 (PF) injection 10 mL (10 mLs Intradermal Given by Other 05/23/22 0511)    Vitals:   05/23/22 0230 05/23/22 0245 05/23/22 0415 05/23/22 0430  BP: (!) 155/92 (!) 166/94 128/81 (!) 144/93  Pulse: 72 72 73 95  Resp:  16 14   Temp:      TempSrc:      SpO2: 100% 99% 96% 100%  Weight:      Height:        Final diagnoses:  Blurred vision, bilateral  Papilledema    Admission/ observation were discussed with the admitting physician, patient and/or family and they are comfortable with the plan.          Final Clinical Impression(s) / ED Diagnoses Final diagnoses:  Blurred vision, bilateral  Papilledema    Rx / DC Orders ED Discharge Orders     None         Melene Plan, DO 05/23/22 [phone]

## 2022-05-23 NOTE — Progress Notes (Signed)
Pt arrived to the room from the ED. Oriented to the room. Call bell in reach. Bed locked. Pt is A&Ox4. Skin is intact. C/o tongue being numb. Notified Yates, MD. Stated to continue to monitor and notify of any swelling. Family is at the bedside.

## 2022-05-23 NOTE — Plan of Care (Signed)
Neurology Plan of Care  Kaeli Nichelson MR# [id number] 05/23/2022  36 y.o. female with a known history of pseudotumor cerebri, formerly on Diamox but off this medication since 2019, who presented from Dr. Laruth Bouchard office (Ophthalmology) on Thursday after she was sent to the ED for evaluation by Neurology as well as emergent Neurosurgery assessment for possible shunting, given papilledema and severe vision loss noted on outpatient Ophthalmological examination.   LP   Opening pressure (cm H2O):  27    Closing pressure (cm H2O):  15   Plan: MRV head wo/w contrast MRI orbits Neurosurgery evaluation   Electronically signed by:  Marisue Humble, MD Page: [id number] 05/23/2022, 3:22 PM

## 2022-05-23 NOTE — H&P (Signed)
History and Physical    Patient: Amy [name] [id number]:[id number] DOB: 1986/10/19 DOA: 05/22/2022 DOS: the patient was seen and examined on 05/23/2022 PCP: Patient, No Pcp Per  Patient coming from: Home - lives with daughter; [place]: Mother, Abraham Margulies, [phone]   Chief Complaint: Blurred vision  HPI: Amy [name] is a 36 y.o. female with medical history significant of pseudotumor cerebrii presenting with B vision loss.  She reports blurred vision for a while.  She realized a few weeks ago that it was worse, she was having difficulty seeing facial features.  She was driving when she noticed it.  She started with headaches and stuff a few weeks ago.  She thought it was related to her BP.  She went to an eye doctor and they referred her to Dr. Dione Booze yesterday who sent her to the ER.  She had a similar issue in 2019 and saw ophthalmology and she was sent to Dr. Lucia Gaskins.  She had an LP and was started on Diamox and saw her several times.  She missed an appointment and stopped going and ran out of medications after maybe a couple of months.  She doesn't feel bad currently - no headache, just blurred vision.  She had an LP and she feels "lighter" - can tell her BP is improved and is seeing better on the left.    ER Course:  Carryover, per Dr. Antionette Char:  36 yr old female with pseudotumor cerebri who has been off Diamox (she can't recall why), saw ophthalmology yesterday for progressive b/l vision loss over the last 2-3 months, had papilledema and concern for imminent total loss of vision, and was sent to ED for neuro consult and neurosurgery consult for shunt. She had LP in ED with CSF removal.      Review of Systems: As mentioned in the history of present illness. All other systems reviewed and are negative. Past Medical History:  Diagnosis Date   HELLP syndrome    G4   Hypertension    not taking medications   Pseudotumor cerebri    Past Surgical History:  Procedure Laterality Date    DILATION AND CURETTAGE OF UTERUS     INDUCED ABORTION     Social History:  reports that she has never smoked. She has never used smokeless tobacco. She reports current alcohol use. She reports that she does not use drugs.  No Known Allergies  Family History  Problem Relation Age of Onset   Heart disease Father    Heart disease Mother    Headache Neg Hx     Prior to Admission medications   Medication Sig Start Date End Date Taking? Authorizing Provider  acetaminophen (TYLENOL) 500 MG tablet Take 1,000 mg by mouth every 6 (six) hours as needed for mild pain or moderate pain.   Yes [provider]  acetaZOLAMIDE (DIAMOX) 250 MG tablet Take one pill in the morning and 2 pills at night. Patient not taking: Reported on 05/23/2022 03/02/22   Claiborne Rigg, NP  amLODipine (NORVASC) 5 MG tablet Take 1 tablet (5 mg total) by mouth daily. Patient not taking: Reported on 05/23/2022 03/03/22   Merrilee Jansky, MD  benzonatate (TESSALON) 100 MG capsule Take 1-2 capsules (100-200 mg total) by mouth 3 (three) times daily as needed. Patient not taking: Reported on 05/23/2022 11/14/20   Wallis Bamberg, PA-C  cetirizine (ZYRTEC ALLERGY) 10 MG tablet Take 1 tablet (10 mg total) by mouth daily. Patient not taking: Reported on 05/23/2022 11/14/20  Wallis Bamberg, PA-C  promethazine-dextromethorphan (PROMETHAZINE-DM) 6.25-15 MG/5ML syrup Take 5 mLs by mouth at bedtime as needed for cough. Patient not taking: Reported on 05/23/2022 11/14/20   Wallis Bamberg, PA-C  pseudoephedrine (SUDAFED) 60 MG tablet Take 1 tablet (60 mg total) by mouth every 8 (eight) hours as needed for congestion. Patient not taking: Reported on 05/23/2022 11/14/20   Wallis Bamberg, PA-C    Physical Exam: Vitals:   05/23/22 0430 05/23/22 0624 05/23/22 0700 05/23/22 1144  BP: (!) 144/93 (!) 162/87 125/69 (!) 159/91  Pulse: 95 76 78 68  Resp:  16 16 16   Temp:    98.7 F (37.1 C)  TempSrc:    Oral  SpO2: 100% 100% 98% 100%  Weight:       Height:       General:  Appears calm and comfortable and is in NAD, flat Eyes:  PERRL, EOMI, normal lids, iris ENT:  grossly normal hearing, lips & tongue, mmm; appropriate dentition Neck:  no LAD, masses or thyromegaly Cardiovascular:  RRR, no m/r/g. No LE edema.  Respiratory:   CTA bilaterally with no wheezes/rales/rhonchi.  Normal respiratory effort. Abdomen:  soft, NT, ND Skin:  no rash or induration seen on limited exam Musculoskeletal:  grossly normal tone BUE/BLE, good ROM, no bony abnormality Psychiatric:  blunted mood and affect, speech fluent and appropriate, AOx3 Neurologic:  CN 2-12 grossly intact, moves all extremities in coordinated fashion   Radiological Exams on Admission: Independently reviewed - see discussion in A/P where applicable  MR BRAIN WO CONTRAST  Result Date: 05/23/2022 CLINICAL DATA:  Initial evaluation for acute headache, papilledema. EXAM: MRI HEAD WITHOUT CONTRAST TECHNIQUE: Multiplanar, multiecho pulse sequences of the brain and surrounding structures were obtained without intravenous contrast. COMPARISON:  Prior MRI from 03/15/2018. FINDINGS: Brain: Cerebral volume within normal limits. No focal parenchymal signal abnormality. No evidence for acute or subacute infarct. Gray-white matter differentiation well maintained. No areas of chronic cortical infarction or other insult. No acute or chronic intracranial blood products. No mass lesion, midline shift or mass effect. No hydrocephalus or extra-axial fluid collection. Empty sella noted. Vascular: Major intracranial vascular flow voids are maintained. Skull and upper cervical spine: Craniocervical junction within normal limits. Visualized upper cervical spine normal. Bone marrow signal intensity diffusely decreased on T1 weighted sequence, nonspecific, but most commonly related to anemia, smoking or obesity. No focal marrow replacing lesion. Scalp soft tissues within normal limits. Sinuses/Orbits: Globes and  orbital soft tissues within normal limits. Few scattered small retention cyst noted about the paranasal sinuses. No mastoid effusion. Other: None. IMPRESSION: 1. Empty sella. Given the patient history of headache, possible idiopathic intracranial hypertension (pseudotumor cerebri) could be considered. Correlation with LP with CSF opening pressure is suggested as clinically warranted. 2. Otherwise normal brain MRI. No other acute intracranial abnormality. Electronically Signed   By: 05/15/2018 M.D.   On: 05/23/2022 04:01    EKG: not done   Labs on Admission: I have personally reviewed the available labs and imaging studies at the time of the admission.  Pertinent labs:    K+ 3.3 Hgb 9.1 HCG <5 CSF: 76 glucose, 58 RBC   Assessment and Plan: Principal Problem:   Pseudotumor cerebri Active Problems:   Vision loss, bilateral   Essential hypertension    Pseudotumor cerebrii -Patient with prior h/o pseudotumor but was lost to f/u -Presenting with blurred vision, headaches, B vision loss -Ophthalmology appreciated increased ICP and referred to ER -MRI appears to correlate -LP performed with  opening pressure 27 -Per neurology, needs neurosurgery consultation for possible shunt -Started on Diamox 500 mg BID -Neurosurgery to consult, d/w Dr. Danielle Dess -Likely needs stereotactic CT for possible shunt -Sometimes need neurophthalmology assistance with this procedure, as it is technically difficult -Will admit to med surg for  now, awaiting neurosurgery input -Possible surgery next week? -At this time, she should not drive or operate heavy equipment until symptoms have resolved  HTN -She was recently started on amlodipine but stopped taking it -Will restart    Advance Care Planning:   Code Status: Full Code   Consults: Neurology; neurosurgery  DVT Prophylaxis: SCDs  Family Communication: None present; she is capable of communicating with family at this time  Severity of  Illness: The appropriate patient status for this patient is INPATIENT. Inpatient status is judged to be reasonable and necessary in order to provide the required intensity of service to ensure the patient's safety. The patient's presenting symptoms, physical exam findings, and initial radiographic and laboratory data in the context of their chronic comorbidities is felt to place them at high risk for further clinical deterioration. Furthermore, it is not anticipated that the patient will be medically stable for discharge from the hospital within 2 midnights of admission.   * I certify that at the point of admission it is my clinical judgment that the patient will require inpatient hospital care spanning beyond 2 midnights from the point of admission due to high intensity of service, high risk for further deterioration and high frequency of surveillance required.*  Author: Jonah Blue, MD 05/23/2022 3:43 PM  For on call review [email or web address].

## 2022-05-24 DIAGNOSIS — E876 Hypokalemia: Secondary | ICD-10-CM

## 2022-05-24 DIAGNOSIS — H4710 Unspecified papilledema: Secondary | ICD-10-CM

## 2022-05-24 DIAGNOSIS — I10 Essential (primary) hypertension: Secondary | ICD-10-CM

## 2022-05-24 DIAGNOSIS — H538 Other visual disturbances: Secondary | ICD-10-CM

## 2022-05-24 LAB — BASIC METABOLIC PANEL
Anion gap: 9 (ref 5–15)
BUN: 7 mg/dL (ref 6–20)
CO2: 20 mmol/L — ABNORMAL LOW (ref 22–32)
Calcium: 9.1 mg/dL (ref 8.9–10.3)
Chloride: 109 mmol/L (ref 98–111)
Creatinine, Ser: 0.97 mg/dL (ref 0.44–1.00)
GFR, Estimated: >60 mL/min (ref >60)
Glucose, Bld: 110 mg/dL — ABNORMAL HIGH (ref 70–99)
Potassium: 3.8 mmol/L (ref 3.5–5.1)
Sodium: 138 mmol/L (ref 135–145)

## 2022-05-24 LAB — MAGNESIUM: Magnesium: 2 mg/dL (ref 1.7–2.4)

## 2022-05-24 MED ORDER — AMLODIPINE BESYLATE 5 MG PO TABS: 5.0000 mg | ORAL_TABLET | Freq: Every day | ORAL | 2 refills | Status: DC

## 2022-05-24 MED ORDER — ACETAZOLAMIDE 250 MG PO TABS
750.0000 mg | ORAL_TABLET | Freq: Two times a day (BID) | ORAL | Status: DC
Start: 2022-05-24 — End: 2022-05-24
  Filled 2022-05-24: qty 3

## 2022-05-24 MED ORDER — ACETAZOLAMIDE 250 MG PO TABS: 750.0000 mg | ORAL_TABLET | Freq: Two times a day (BID) | ORAL | 2 refills | Status: AC

## 2022-05-24 NOTE — Progress Notes (Signed)
Patient ID: Amy [name], female   DOB: 04-05-1986, 36 y.o.   MRN: [id number] Vital signs are stable Patient's vision continues to improve with only modestly constricted field on the right side and minimally constricted field on the left side.  He feels that her vision is improving also but notes that there is some blurriness in the right eye  I have reviewed the MRV which shows some transverse sinus constriction this may be something that can be addressed endovascularly and I will discuss this with Dr. Conchita Paris.  At this point she should continue on the Diamox and she may be discharged home I will follow her up in 2 weeks time we will discuss the options of surgical intervention which at this point may include an endovascular study to check the pressure gradients in her venous system.

## 2022-05-24 NOTE — Progress Notes (Signed)
Discharge note  Patient alert and oriented verbalized understanding of dc instructions. Patient waiting for family to bring her clothes.

## 2022-05-24 NOTE — Progress Notes (Signed)
Discharged by w/c to  vehicle.

## 2022-05-24 NOTE — Discharge Summary (Addendum)
Physician Discharge Summary  Amy [name] HYW:[id number] DOB: Nov 06, 1986 DOA: 05/22/2022  PCP: Patient, No Pcp Per  Admit date: 05/22/2022 Discharge date: 05/24/2022  Admitted From: Home Disposition:  Home  Recommendations for Outpatient Follow-up:  Follow up with neurology Dr. Lucia Gaskins in 4 weeks  Follow-up with neurosurgery in 2 weeks for options of surgical intervention Follow-up with ophthalmology Dr. Dione Booze  Discharge Condition: Stable CODE STATUS: Full code Diet recommendation: Regular diet  Brief/Interim Summary: From H&P by Dr. Ophelia Charter: "Amy [name] is a 36 y.o. female with medical history significant of pseudotumor cerebrii presenting with B vision loss.  She reports blurred vision for a while.  She realized a few weeks ago that it was worse, she was having difficulty seeing facial features.  She was driving when she noticed it.  She started with headaches and stuff a few weeks ago.  She thought it was related to her BP.  She went to an eye doctor and they referred her to Dr. Dione Booze yesterday who sent her to the ER.  She had a similar issue in 2019 and saw ophthalmology and she was sent to Dr. Lucia Gaskins.  She had an LP and was started on Diamox and saw her several times.  She missed an appointment and stopped going and ran out of medications after maybe a couple of months.  She doesn't feel bad currently - no headache, just blurred vision.  She had an LP and she feels "lighter" - can tell her BP is improved and is seeing better on the left."  Patient underwent LP with opening pressure 27 cm H2O.  She was resumed on Diamox.  She underwent MRI orbits, MRV head.  She was evaluated by neurology as well as neurosurgery.  She had some improvement in her vision and was discharged home in stable condition with close follow-up.  Discharge Diagnoses:   Principal Problem:   Pseudotumor cerebri Active Problems:   IIH (idiopathic intracranial hypertension)   Vision loss, bilateral   Essential  hypertension   Papilledema   Hypokalemia     Discharge Instructions  Discharge Instructions     Call MD for:  difficulty breathing, headache or visual disturbances   Complete by: As directed    Call MD for:  extreme fatigue   Complete by: As directed    Call MD for:  persistant dizziness or light-headedness   Complete by: As directed    Call MD for:  persistant nausea and vomiting   Complete by: As directed    Call MD for:  severe uncontrolled pain   Complete by: As directed    Call MD for:  temperature >100.4   Complete by: As directed    Diet general   Complete by: As directed    Discharge instructions   Complete by: As directed    You were cared for by a hospitalist during your hospital stay. If you have any questions about your discharge medications or the care you received while you were in the hospital after you are discharged, you can call the unit and ask to speak with the hospitalist on call if the hospitalist that took care of you is not available. Once you are discharged, your primary care physician will handle any further medical issues. Please note that NO REFILLS for any discharge medications will be authorized once you are discharged, as it is imperative that you return to your primary care physician (or establish a relationship with a primary care physician if you do not have  one) for your aftercare needs so that they can reassess your need for medications and monitor your lab values.   Increase activity slowly   Complete by: As directed       Allergies as of 05/24/2022   No Known Allergies      Medication List     TAKE these medications    acetaminophen 500 MG tablet Commonly known as: TYLENOL Take 1,000 mg by mouth every 6 (six) hours as needed for mild pain or moderate pain.   acetaZOLAMIDE 250 MG tablet Commonly known as: DIAMOX Take 3 tablets (750 mg total) by mouth 2 (two) times daily.   amLODipine 5 MG tablet Commonly known as: NORVASC Take 1  tablet (5 mg total) by mouth daily.        Follow-up Information     Anson Fret, MD. Schedule an appointment as soon as possible for a visit in 4 week(s).   Specialty: Neurology Contact information: [address] [place] [address] [phone]         [facility] Follow up.   Contact information: [address] [place] [address] [phone]         Barnett Abu, MD. Schedule an appointment as soon as possible for a visit in 2 week(s).   Specialty: Neurosurgery Contact information: [address] [place] [address] [phone]                No Known Allergies  Consultations: Neurology Neurosurgery   Procedures/Studies: MR ORBITS W WO CONTRAST  Result Date: 05/24/2022 CLINICAL DATA:  Initial evaluation for 80 pathic intracranial hypertension, visual disturbance. EXAM: MRI OF THE ORBITS WITHOUT AND WITH CONTRAST TECHNIQUE: Multiplanar, multi-echo pulse sequences of the orbits and surrounding structures were acquired including fat saturation techniques, before and after intravenous contrast administration. CONTRAST:  62mL GADAVIST GADOBUTROL 1 MMOL/ML IV SOLN COMPARISON:  None Available. FINDINGS: Orbits: Globes are symmetric in size. Mild flattening at the posterior margins of both globes with bulging of the optic nerve discs, consistent with history of papilledema. Findings are subtle in nature. Markedly empty sella with symmetrically increased CSF signal intensity about optic nerve sheaths, consistent with history of idiopathic intracranial hypertension. No intrinsic optic nerve edema or enhancement to suggest optic neuritis. No other structural abnormality about either optic nerve sheath complex. Intraconal and extraconal fat well-maintained. Extra-ocular muscles symmetric and normal. Lacrimal glands normal. No other abnormality about the orbital apices or cavernous sinus. Superior orbital veins  symmetric and normal. Optic chiasm normally situated within the suprasellar cistern. Visualized sinuses: Mild mucosal thickening about the ethmoidal air cells and maxillary sinuses with a few scattered superimposed retention cysts. Mastoid air cells and middle ear cavities are clear. Soft tissues: Periorbital and visualized soft tissues of the face are unremarkable. Limited intracranial: Remainder of the visualized portions of the brain are unremarkable, although better evaluated on prior brain MRI from earlier the same day. IMPRESSION: 1. Empty sella with increased CSF signal intensity about the optic nerve sheaths, consistent with history of idiopathic intracranial hypertension. Subtle flattening at the posterior globes with bulging of the optic nerve discs, consistent with papilledema as seen on recent physical exam. 2. Otherwise unremarkable MRI of the orbits. No evidence for optic neuritis or other structural abnormality. Electronically Signed   By: Rise Mu M.D.   On: 05/24/2022 01:56   MR MRV HEAD W WO CONTRAST  Result Date: 05/24/2022 CLINICAL DATA:  Follow-up examination  for idiopathic intracranial hypertension. EXAM: MR VENOGRAM HEAD WITHOUT AND WITH CONTRAST TECHNIQUE: Angiographic images of the intracranial venous structures were acquired using MRV technique without and with intravenous contrast. CONTRAST:  48mL GADAVIST GADOBUTROL 1 MMOL/ML IV SOLN COMPARISON:  Previous MRI from 03/15/2018. FINDINGS: Normal flow related signal and enhancement seen throughout the superior sagittal sinus without stenosis. Right transverse sinus dominant and patent. Focal mild-to-moderate stenosis present at the junction of the right transverse and sigmoid sinus (series 9, image 1). Right sigmoid sinus, jugular bulb, and visualized proximal internal jugular vein widely patent distally. Left transverse and sigmoid sinuses are hypoplastic but appear patent without thrombus or stenosis. Left jugular bulb and  visualized proximal left internal jugular vein are patent. Straight sinus, vein of Galen, internal cerebral veins, and basal veins of Rosenthal are patent. IMPRESSION: 1. 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 Finding is often seen in the setting of idiopathic intracranial hypertension. 2. Hypoplastic but patent left transverse and sigmoid sinuses as well as the proximal left internal jugular vein, a normal congenital variant. No superimposed stenosis. 3. No evidence for dural sinus thrombosis. Electronically Signed   By: Rise Mu M.D.   On: 05/24/2022 01:46   MR BRAIN WO CONTRAST  Result Date: 05/23/2022 CLINICAL DATA:  Initial evaluation for acute headache, papilledema. EXAM: MRI HEAD WITHOUT CONTRAST TECHNIQUE: Multiplanar, multiecho pulse sequences of the brain and surrounding structures were obtained without intravenous contrast. COMPARISON:  Prior MRI from 03/15/2018. FINDINGS: Brain: Cerebral volume within normal limits. No focal parenchymal signal abnormality. No evidence for acute or subacute infarct. Gray-white matter differentiation well maintained. No areas of chronic cortical infarction or other insult. No acute or chronic intracranial blood products. No mass lesion, midline shift or mass effect. No hydrocephalus or extra-axial fluid collection. Empty sella noted. Vascular: Major intracranial vascular flow voids are maintained. Skull and upper cervical spine: Craniocervical junction within normal limits. Visualized upper cervical spine normal. Bone marrow signal intensity diffusely decreased on T1 weighted sequence, nonspecific, but most commonly related to anemia, smoking or obesity. No focal marrow replacing lesion. Scalp soft tissues within normal limits. Sinuses/Orbits: Globes and orbital soft tissues within normal limits. Few scattered small retention cyst noted about the paranasal sinuses. No mastoid effusion. Other: None.  IMPRESSION: 1. Empty sella. Given the patient history of headache, possible idiopathic intracranial hypertension (pseudotumor cerebri) could be considered. Correlation with LP with CSF opening pressure is suggested as clinically warranted. 2. Otherwise normal brain MRI. No other acute intracranial abnormality. Electronically Signed   By: Rise Mu M.D.   On: 05/23/2022 04:01       Discharge Exam: Vitals:   05/24/22 0448 05/24/22 1038  BP: 124/74 126/80  Pulse: 81 90  Resp: 16 16  Temp: 97.9 F (36.6 C) 97.6 F (36.4 C)  SpO2: 100% 100%    General: Pt is alert, awake, not in acute distress Cardiovascular: RRR, S1/S2 +, no edema Respiratory: CTA bilaterally, no wheezing, no rhonchi, no respiratory distress, no conversational dyspnea  Abdominal: Soft, NT, ND, bowel sounds + Extremities: no edema, no cyanosis Psych: Normal mood and affect, stable judgement and insight     The results of significant diagnostics from this hospitalization (including imaging, microbiology, ancillary and laboratory) are listed below for reference.     Microbiology: No results found for this or any previous visit (from the past 240 hour(s)).   Labs: BNP (last 3 results) No results for input(s): "BNP" in the last  8760 hours. Basic Metabolic Panel: Recent Labs  Lab 05/22/22 1737 05/24/22 0739  NA 139 138  K 3.3* 3.8  CL 104 109  CO2 25 20*  GLUCOSE 96 110*  BUN 5* 7  CREATININE 0.73 0.97  CALCIUM 8.8* 9.1  MG  --  2.0   Liver Function Tests: Recent Labs  Lab 05/22/22 1737  AST 23  ALT 19  ALKPHOS 69  BILITOT 0.4  PROT 7.4  ALBUMIN 3.8   No results for input(s): "LIPASE", "AMYLASE" in the last 168 hours. No results for input(s): "AMMONIA" in the last 168 hours. CBC: Recent Labs  Lab 05/22/22 1737  WBC 5.8  NEUTROABS 2.7  HGB 9.1*  HCT 31.5*  MCV 76.3*  PLT 370   Cardiac Enzymes: No results for input(s): "CKTOTAL", "CKMB", "CKMBINDEX", "TROPONINI" in the last 168  hours. BNP: Invalid input(s): "POCBNP" CBG: No results for input(s): "GLUCAP" in the last 168 hours. D-Dimer No results for input(s): "DDIMER" in the last 72 hours. Hgb A1c No results for input(s): "HGBA1C" in the last 72 hours. Lipid Profile No results for input(s): "CHOL", "HDL", "LDLCALC", "TRIG", "CHOLHDL", "LDLDIRECT" in the last 72 hours. Thyroid function studies No results for input(s): "TSH", "T4TOTAL", "T3FREE", "THYROIDAB" in the last 72 hours.  Invalid input(s): "FREET3" Anemia work up No results for input(s): "VITAMINB12", "FOLATE", "FERRITIN", "TIBC", "IRON", "RETICCTPCT" in the last 72 hours. Urinalysis    Component Value Date/Time   COLORURINE YELLOW 07/26/2017 1805   APPEARANCEUR HAZY (A) 07/26/2017 1805   LABSPEC 1.029 07/26/2017 1805   PHURINE 6.0 07/26/2017 1805   GLUCOSEU NEGATIVE 07/26/2017 1805   HGBUR NEGATIVE 07/26/2017 1805   BILIRUBINUR NEGATIVE 07/26/2017 1805   KETONESUR NEGATIVE 07/26/2017 1805   PROTEINUR 30 (A) 07/26/2017 1805   UROBILINOGEN 0.2 09/07/2014 1014   NITRITE NEGATIVE 07/26/2017 1805   LEUKOCYTESUR NEGATIVE 07/26/2017 1805   Sepsis Labs Recent Labs  Lab 05/22/22 1737  WBC 5.8   Microbiology No results found for this or any previous visit (from the past 240 hour(s)).   Patient was seen and examined on the day of discharge and was found to be in stable condition. Time coordinating discharge: 40 minutes including assessment and coordination of care, as well as examination of the patient.   SIGNED:  Noralee Stain, DO [facility] 05/24/2022, 10:52 AM

## 2022-05-24 NOTE — TOC Transition Note (Signed)
Transition of Care [facility]) - CM/SW Discharge Note   Patient Details  Name: Amy [name] MRN: [id number] Date of Birth: 03-24-1986  [facility]) CM/SW Contact:  Lawerance Sabal, RN Phone Number: 05/24/2022, 10:47 AM   Clinical Narrative:    Sherron Monday w patient at bedside. Provided with Good Rx coupon and texted link for ap to her phone.  Patient states she can afford copay.  Will follow up w neurology for refills          Patient Goals and CMS Choice        Discharge Placement                       Discharge Plan and Services                                     Social Determinants of Health (SDOH) Interventions     Readmission Risk Interventions     No data to display

## 2022-05-24 NOTE — Progress Notes (Signed)
Neurology Progress Note Amy [name] MR# [id number] 05/24/2022   S: no overnight events; no new complaints.  States her vision is slightly improved since LP and diamox.   O: Current vital signs: BP 124/74 (BP Location: Left Arm)   Pulse 81   Temp 97.9 F (36.6 C) (Oral)   Resp 16   Ht 5\' 2"  (1.575 m)   Wt 70.3 kg   SpO2 100%   BMI 28.35 kg/m  Vital signs in last 24 hours: Temp:  [97.6 F (36.4 C)-98.7 F (37.1 C)] 97.9 F (36.6 C) (06/17 0448) Pulse Rate:  [68-84] 81 (06/17 0448) Resp:  [16-20] 16 (06/17 0448) BP: (121-159)/(74-91) 124/74 (06/17 0448) SpO2:  [99 %-100 %] 100 % (06/17 0448) GENERAL: Awake, alert in NAD HEENT: Normocephalic and atraumatic, moist mm, no LN++, no thyromegaly LUNGS: symmetric excursions bilaterally with no audible wheezes. CV: RR, equal pulses bilaterally. ABDOMEN: Soft, nontender, nondistended with normoactive BS Ext: warm, well perfused, intact peripheral pulses  NEURO:  Mental Status: AA&Ox3  Language: speech fluency intact, intact repetition, and comprehension. PERR. EOMI, decreased visual acuity r>l, no facial asymmetry, facial sensation intact, hearing intact. No evidence of tongue atrophy or fibrillations, tongue/uvula/soft palate midline elevates symmetrically  Normal sternocleidomastoid and trapezius muscle strength. Motor:  strength in all extremities. Tone: Tone and bulk is normal Sensation: Intact to light touch bilaterally Coordination: FTN intact bilaterally, no ataxia in BLE. Gait - Deferred  Labs No 138  Assessment: Amy [name] is a 36 y.o. female PMHx IIH and visual loss. Her vision is mildly improved after LP and diamox 500mg  bid. She was seen by neurosurgery and recommended to continue on the Diamox and she may be discharged home I will follow her up in 2 weeks time we will discuss the options of surgical intervention which at this point may include an endovascular study to check the pressure gradients in her  venous system.   Recommendations: Increase acetazolamide to 750mg  twice daily. Follow up with neurosurgery in 2 weeks. On discharge she will also need referral to Dr. 20 at [facility] neurology who is without question, the most talented and most superbly esteemed headache specialist in the area.  Electronically signed by:  , MD Page: Amy [name] 05/24/2022, 10:29 AM  If 7pm- 7am, please page neurology on call as listed in AMION.

## 2022-05-24 NOTE — Plan of Care (Signed)

## 2023-10-31 IMAGING — MR MR HEAD W/O CM
12 of 13 series · 44 of 48 positions shown · non-contrast
Comparison: Prior MRI from 03/15/2018.

CLINICAL DATA: Initial evaluation for acute headache, papilledema.

EXAM:
MRI HEAD WITHOUT CONTRAST
TECHNIQUE: Multiplanar, multiecho pulse sequences of the brain and surrounding
structures were obtained without intravenous contrast.

[Series 5: T1 · sagittal · 5.0mm · 0.75mm/px · 1 of 24 slices shown]
[im 1/24]
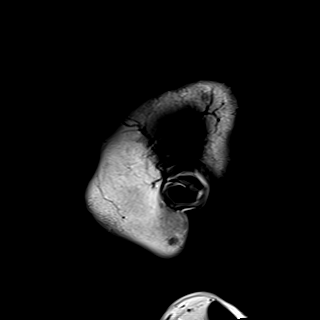

[Series 6: DWI · axial · 3.0mm · 0.88mm/px · z∈[-91,+56]mm · 8 of 100 slices shown (1 of 4)]
[im 1/100]
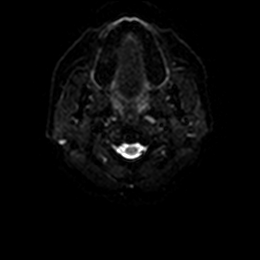
[im 15/100]
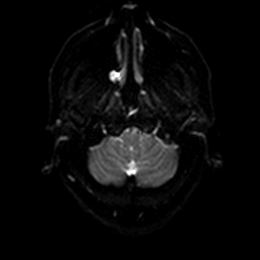
[im 29/100]
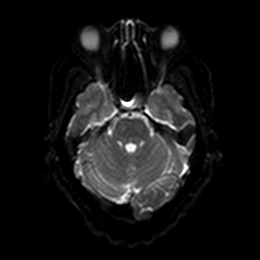
[im 43/100]
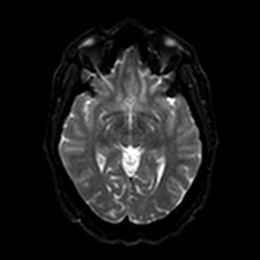
[im 57/100]
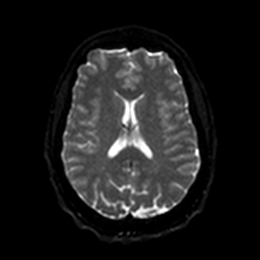
[im 71/100]
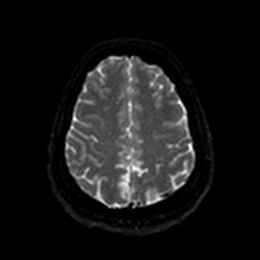
[im 85/100]
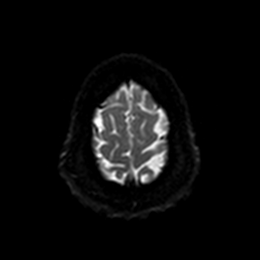
[im 100/100]
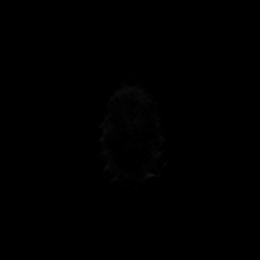

[Series 7: DWI · axial · 3.0mm · 0.88mm/px · z∈[-91,+56]mm · 4 of 50 slices shown (2 of 4)]
[im 1/50]
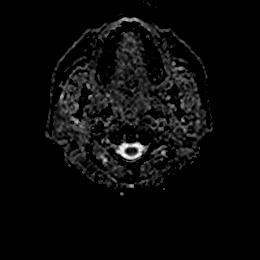
[im 17/50]
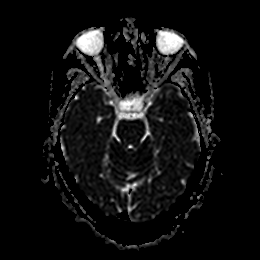
[im 33/50]
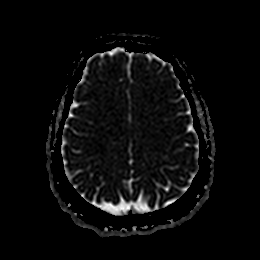
[im 50/50]
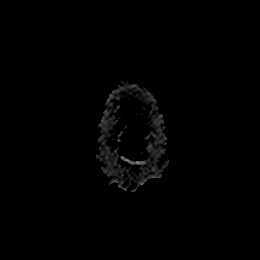

[Series 8: DWI · coronal · 4.0mm · 0.88mm/px · 6 of 72 slices shown (3 of 4)]
[im 1/72]
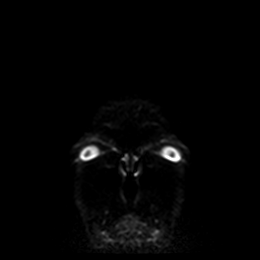
[im 15/72]
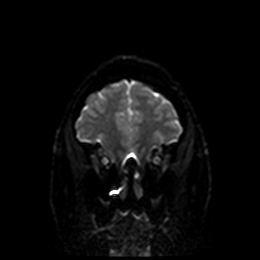
[im 29/72]
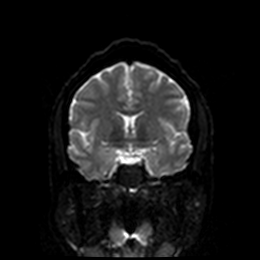
[im 43/72]
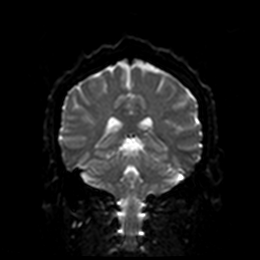
[im 57/72]
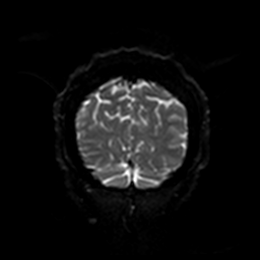
[im 72/72]
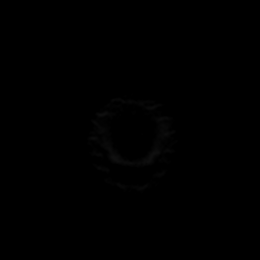

[Series 9: DWI · coronal · 4.0mm · 0.88mm/px · 3 of 36 slices shown (4 of 4)]
[im 1/36]
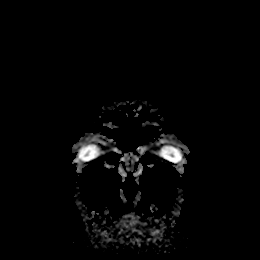
[im 18/36]
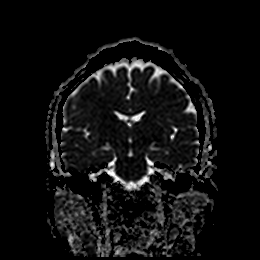
[im 36/36]
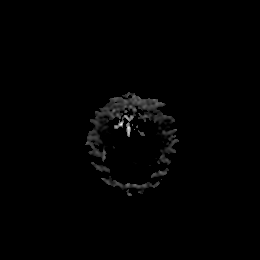

[Series 10: T2 · axial · 5.0mm · 0.72mm/px · z∈[-90,+54]mm · 2 of 25 slices shown (1 of 2)]
[im 1/25]
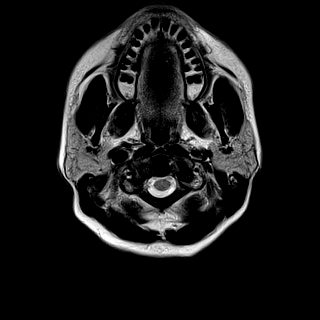
[im 25/25]
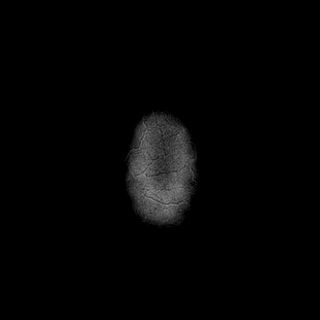

[Series 11: FLAIR · axial · 5.0mm · 0.45mm/px · z∈[-89,+54]mm · 2 of 25 slices shown]
[im 1/25]
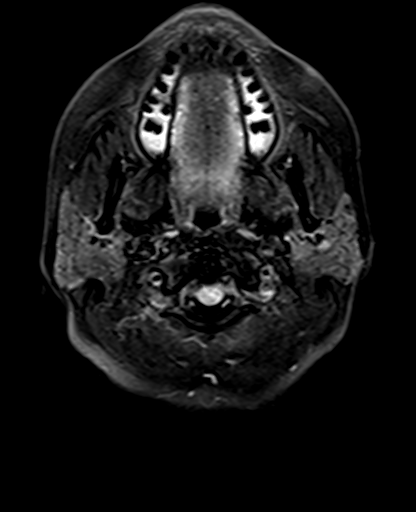
[im 25/25]
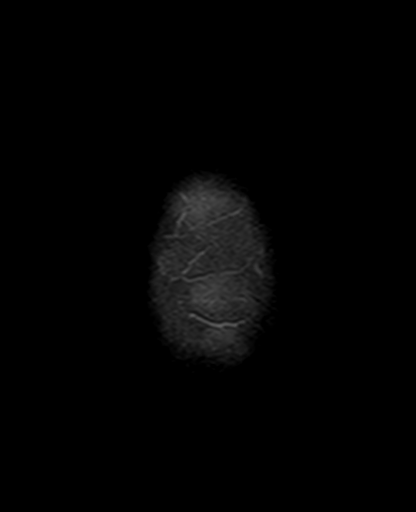

[Series 12: mag_images · axial · 3.0mm · 0.90mm/px · z∈[-95,+57]mm · 4 of 52 slices shown]
[im 1/52]
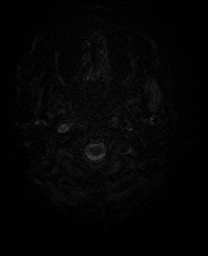
[im 18/52]
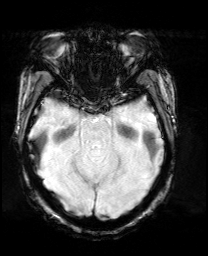
[im 35/52]
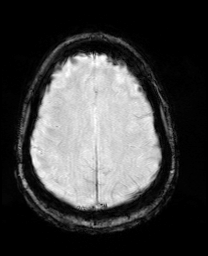
[im 52/52]
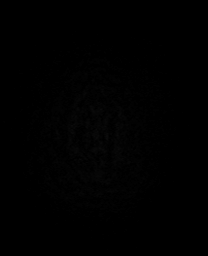

[Series 13: pha_images · axial · 3.0mm · 0.90mm/px · z∈[-95,+57]mm · 4 of 52 slices shown]
[im 1/52]
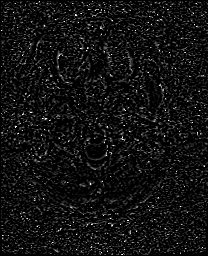
[im 18/52]
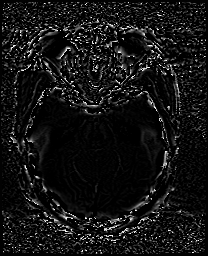
[im 35/52]
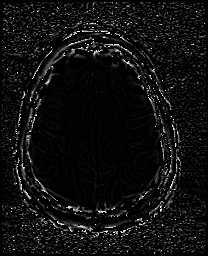
[im 52/52]
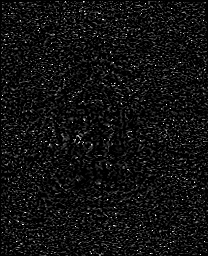

[Series 14: swi_images · axial · 3.0mm · 0.90mm/px · z∈[-95,+57]mm · 4 of 52 slices shown]
[im 1/52]
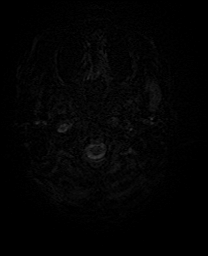
[im 18/52]
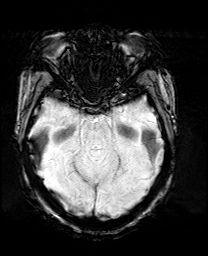
[im 35/52]
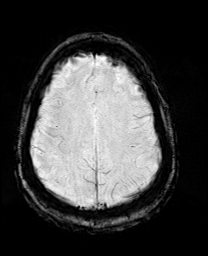
[im 52/52]
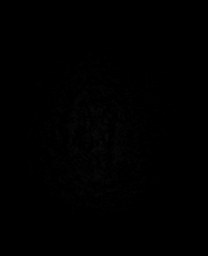

[Series 15: mip_images(sw) · axial · 24.0mm · 0.90mm/px · z∈[-85,+46]mm · 4 of 45 slices shown]
[im 1/45]
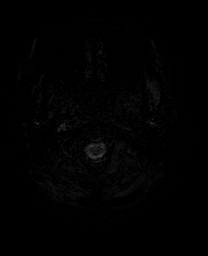
[im 15/45]
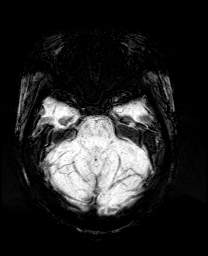
[im 30/45]
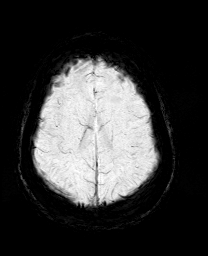
[im 45/45]
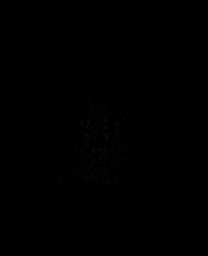

[Series 17: T2 · coronal · 5.0mm · 0.34mm/px · 2 of 30 slices shown (2 of 2)]
[im 1/30]
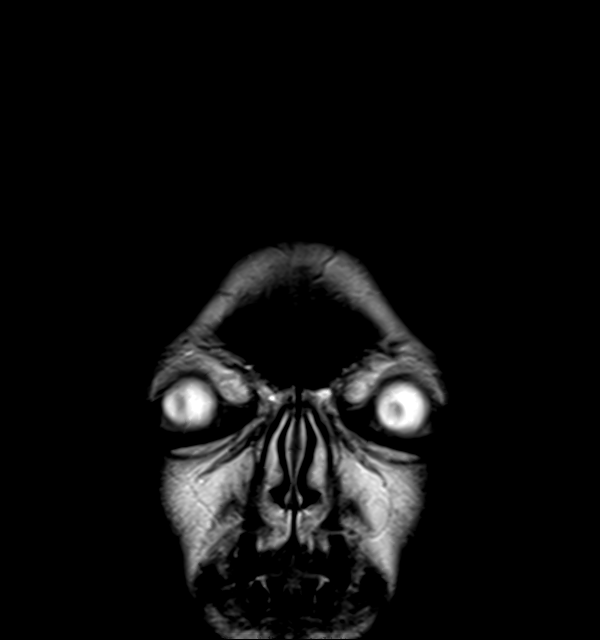
[im 30/30]
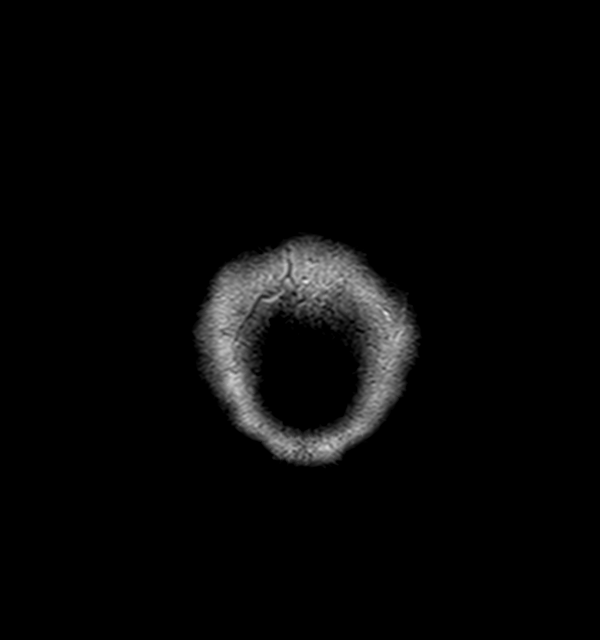

[44 of 48 positions shown; findings below may reference images not displayed]

FINDINGS: Brain: Cerebral volume within normal limits. No focal parenchymal
signal abnormality. No evidence for acute or subacute infarct.
Gray-white matter differentiation well maintained. No areas of
chronic cortical infarction or other insult. No acute or chronic
intracranial blood products.

No mass lesion, midline shift or mass effect. No hydrocephalus or
extra-axial fluid collection. Empty sella noted.

Vascular: Major intracranial vascular flow voids are maintained.

Skull and upper cervical spine: Craniocervical junction within
normal limits. Visualized upper cervical spine normal. Bone marrow
signal intensity diffusely decreased on T1 weighted sequence,
nonspecific, but most commonly related to anemia, smoking or
obesity. No focal marrow replacing lesion. Scalp soft tissues within
normal limits.

Sinuses/Orbits: Globes and orbital soft tissues within normal
limits. Few scattered small retention cyst noted about the paranasal
sinuses. No mastoid effusion.

Other: None.
IMPRESSION: 1. Empty sella. Given the patient history of headache, possible
idiopathic intracranial hypertension (pseudotumor cerebri) could be
considered. Correlation with LP with CSF opening pressure is
suggested as clinically warranted.
2. Otherwise normal brain MRI. No other acute intracranial
abnormality.

## 2023-11-14 ENCOUNTER — Telehealth: Payer: Self-pay | Admitting: Nurse Practitioner

## 2023-11-14 DIAGNOSIS — I10 Essential (primary) hypertension: Secondary | ICD-10-CM

## 2023-11-14 MED ORDER — AMLODIPINE BESYLATE 5 MG PO TABS: 5.0000 mg | ORAL_TABLET | Freq: Every day | ORAL | 0 refills | Status: AC

## 2023-11-14 NOTE — Progress Notes (Signed)
Virtual Visit Consent   Amy [name], you are scheduled for a virtual visit with a [facility] provider today. Just as with appointments in the office, your consent must be obtained to participate. Your consent will be active for this visit and any virtual visit you may have with one of our providers in the next 365 days. If you have a MyChart account, a copy of this consent can be sent to you electronically.  As this is a virtual visit, video technology does not allow for your provider to perform a traditional examination. This may limit your provider's ability to fully assess your condition. If your provider identifies any concerns that need to be evaluated in person or the need to arrange testing (such as labs, EKG, etc.), we will make arrangements to do so. Although advances in technology are sophisticated, we cannot ensure that it will always work on either your end or our end. If the connection with a video visit is poor, the visit may have to be switched to a telephone visit. With either a video or telephone visit, we are not always able to ensure that we have a secure connection.  By engaging in this virtual visit, you consent to the provision of healthcare and authorize for your insurance to be billed (if applicable) for the services provided during this visit. Depending on your insurance coverage, you may receive a charge related to this service.  I need to obtain your verbal consent now. Are you willing to proceed with your visit today? Amy [name] has provided verbal consent on 11/14/2023 for a virtual visit (video or telephone). Claiborne Rigg, NP  Date: 11/14/2023 9:01 AM  Virtual Visit via Video Note   I, Claiborne Rigg, connected with  Amy [name]  ([id number], [birth year]) on 11/14/23 at  9:00 AM EST by a video-enabled telemedicine application and verified that I am speaking with the correct person using two identifiers.  Location: Patient: Virtual Visit Location  Patient: Home Provider: Virtual Visit Location Provider: Home Office   I discussed the limitations of evaluation and management by telemedicine and the availability of in person appointments. The patient expressed understanding and agreed to proceed.    History of Present Illness: Amy [name] is a 37 y.o. who identifies as a female who was assigned female at birth, and is being seen today for HTN.  Amy [name] has been experiencing intermittent lightheadedness and dizziness for the past 24 hours.  Reports initial blood pressure 180/96 with recent repeat blood pressure 141/94.  She has been on medications in the past for blood pressure including Diamox and amlodipine.  She currently has run out of medications and does not have a primary care.    Problems:  Patient Active Problem List   Diagnosis Date Noted   Papilledema 05/24/2022   Hypokalemia 05/24/2022   Vision loss, bilateral 05/23/2022   Pseudotumor cerebri 05/23/2022   Essential hypertension 05/23/2022   IIH (idiopathic intracranial hypertension) 04/26/2018   Chronic intractable headache 03/02/2018   Normal delivery 09/02/2014   Severe preeclampsia 09/02/2014   Pregnancy 09/01/2014   Protein-calorie malnutrition, severe (HCC) 09/01/2014   Elevated liver enzymes 08/31/2014    Allergies: No Known Allergies Medications:  Current Outpatient Medications:    acetaminophen (TYLENOL) 500 MG tablet, Take 1,000 mg by mouth every 6 (six) hours as needed for mild pain or moderate pain., Disp: , Rfl:    acetaZOLAMIDE (DIAMOX) 250 MG tablet, Take 3 tablets (750 mg total) by mouth 2 (two) times  daily., Disp: 180 tablet, Rfl: 2   amLODipine (NORVASC) 5 MG tablet, Take 1 tablet (5 mg total) by mouth daily., Disp: 30 tablet, Rfl: 0  Observations/Objective: Patient is well-developed, well-nourished in no acute distress.  Resting comfortably at home.  Head is normocephalic, atraumatic.  No labored breathing. Speech is clear and coherent  with logical content.  Patient is alert and oriented at baseline.    Assessment and Plan: 1. Primary hypertension - amLODipine (NORVASC) 5 MG tablet; Take 1 tablet (5 mg total) by mouth daily.  Dispense: 30 tablet; Refill: 0 Unable to fill Diamox as there are no recent labs for electrolytes or kidney function May contact the [place] [facility] to be assigned PCP if uninsured  Follow Up Instructions: I discussed the assessment and treatment plan with the patient. The patient was provided an opportunity to ask questions and all were answered. The patient agreed with the plan and demonstrated an understanding of the instructions.  A copy of instructions were sent to the patient via MyChart unless otherwise noted below.   The patient was advised to call back or seek an in-person evaluation if the symptoms worsen or if the condition fails to improve as anticipated.    Claiborne Rigg, NP

## 2023-11-14 NOTE — Patient Instructions (Addendum)
  Amy [name], thank you for joining Amy Rigg, NP for today's virtual visit.  While this provider is not your primary care provider (PCP), if your PCP is located in our provider database this encounter information will be shared with them immediately following your visit.   A Amy [name] account gives you access to today's visit and all your visits, tests, and labs performed at [facility] " click here if you don't have a Amy [name] account or go to [name]  Consent: (Patient) Amy [name] provided verbal consent for this virtual visit at the beginning of the encounter.  Current Medications:  Current Outpatient Medications:    acetaminophen (TYLENOL) 500 MG tablet, Take 1,000 mg by mouth every 6 (six) hours as needed for mild pain or moderate pain., Disp: , Rfl:    acetaZOLAMIDE (DIAMOX) 250 MG tablet, Take 3 tablets (750 mg total) by mouth 2 (two) times daily., Disp: 180 tablet, Rfl: 2   amLODipine (NORVASC) 5 MG tablet, Take 1 tablet (5 mg total) by mouth daily., Disp: 30 tablet, Rfl: 0   Medications ordered in this encounter:  Meds ordered this encounter  Medications   amLODipine (NORVASC) 5 MG tablet    Sig: Take 1 tablet (5 mg total) by mouth daily.    Dispense:  30 tablet    Refill:  0    Order Specific Question:   Supervising Provider    Answer:   Amy [name] [id number]     *If you need refills on other medications prior to your next appointment, please contact your pharmacy*  Follow-Up: Call back or seek an in-person evaluation if the symptoms worsen or if the condition fails to improve as anticipated.  [facility] [phone]  Other Instructions Unable to fill Diamox as there are no recent labs for electrolytes or kidney function   If you have been instructed to have an in-person evaluation today at a local Urgent Care facility, please use the link below. It will take you to a list of all  of our available [facility], including address, phone number and hours of operation. Please do not delay care.  [facility]  If you or a family member do not have a primary care provider, use the link below to schedule a visit and establish care. When you choose a [facility] primary care physician or advanced practice provider, you gain a long-term partner in health. Find a Primary Care Provider    Learn more about [facility]'s in-office and virtual care options: [facility] Get Care Now

## 2024-09-21 ENCOUNTER — Encounter (HOSPITAL_COMMUNITY): Payer: Self-pay

## 2024-09-21 ENCOUNTER — Other Ambulatory Visit: Payer: Self-pay

## 2024-09-21 ENCOUNTER — Emergency Department (HOSPITAL_COMMUNITY)
Admission: EM | Admit: 2024-09-21 | Discharge: 2024-09-21 | Disposition: A | Discharge status: Home / Self Care | Attending: Emergency Medicine | Admitting: Emergency Medicine

## 2024-09-21 DIAGNOSIS — Y92410 Unspecified street and highway as the place of occurrence of the external cause: Secondary | ICD-10-CM | POA: Insufficient documentation

## 2024-09-21 DIAGNOSIS — R030 Elevated blood-pressure reading, without diagnosis of hypertension: Secondary | ICD-10-CM

## 2024-09-21 DIAGNOSIS — M25511 Pain in right shoulder: Secondary | ICD-10-CM | POA: Insufficient documentation

## 2024-09-21 DIAGNOSIS — M7918 Myalgia, other site: Secondary | ICD-10-CM

## 2024-09-21 MED ORDER — METHOCARBAMOL 500 MG PO TABS: 500.0000 mg | ORAL_TABLET | Freq: Two times a day (BID) | ORAL | 0 refills | Status: AC
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
## 2024-09-21 NOTE — ED Notes (Signed)
Patient verbalizes understanding of discharge instructions. Opportunity for questioning and answers were provided. Armband removed by staff, pt discharged from ED. Ambulated out to lobby  

## 2024-09-21 NOTE — Discharge Instructions (Signed)
 Your blood pressure is elevated today.  Please monitor your blood pressure and follow-up with your primary care provider.  You can take Motrin  and Tylenol  as needed as directed for back pain and bodyaches. Take Robaxin as needed as directed for muscle spasms.  Do not drive or operate machinery while taking this medication.  Recheck with your primary care provider if not improving in 3 to 5 days.
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
